# Patient Record
Sex: Female | Born: 1995 | Hispanic: Yes | Marital: Single | Smoking: Never smoker
Health system: Southern US, Community
[De-identification: ages and names within clinical notes are randomized; demographics above are authoritative.]

## PROBLEM LIST (undated history)

## (undated) DIAGNOSIS — Z789 Other specified health status: Secondary | ICD-10-CM

## (undated) DIAGNOSIS — R87612 Low grade squamous intraepithelial lesion on cytologic smear of cervix (LGSIL): Secondary | ICD-10-CM

## (undated) HISTORY — DX: Low grade squamous intraepithelial lesion on cytologic smear of cervix (LGSIL): R87.612

## (undated) HISTORY — PX: NO PAST SURGERIES: SHX2092

---
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
## 2016-10-07 ENCOUNTER — Emergency Department (HOSPITAL_BASED_OUTPATIENT_CLINIC_OR_DEPARTMENT_OTHER): Payer: Self-pay

## 2016-10-07 ENCOUNTER — Emergency Department (HOSPITAL_BASED_OUTPATIENT_CLINIC_OR_DEPARTMENT_OTHER)
Admission: EM | Admit: 2016-10-07 | Discharge: 2016-10-07 | Disposition: A | Discharge status: Home / Self Care | Payer: Self-pay | Attending: Emergency Medicine | Admitting: Emergency Medicine

## 2016-10-07 ENCOUNTER — Encounter (HOSPITAL_BASED_OUTPATIENT_CLINIC_OR_DEPARTMENT_OTHER): Payer: Self-pay | Admitting: *Deleted

## 2016-10-07 DIAGNOSIS — N390 Urinary tract infection, site not specified: Secondary | ICD-10-CM | POA: Insufficient documentation

## 2016-10-07 DIAGNOSIS — N83202 Unspecified ovarian cyst, left side: Secondary | ICD-10-CM | POA: Insufficient documentation

## 2016-10-07 LAB — COMPREHENSIVE METABOLIC PANEL WITH GFR
ALT: 31 U/L (ref 14–54)
AST: 25 U/L (ref 15–41)
Albumin: 4.1 g/dL (ref 3.5–5.0)
Alkaline Phosphatase: 61 U/L (ref 38–126)
Anion gap: 7 (ref 5–15)
BUN: 18 mg/dL (ref 6–20)
CO2: 26 mmol/L (ref 22–32)
Calcium: 9.7 mg/dL (ref 8.9–10.3)
Chloride: 103 mmol/L (ref 101–111)
Creatinine, Ser: 0.76 mg/dL (ref 0.44–1.00)
GFR calc Af Amer: >60 mL/min
GFR calc non Af Amer: >60 mL/min
Glucose, Bld: 84 mg/dL (ref 65–99)
Potassium: 3.6 mmol/L (ref 3.5–5.1)
Sodium: 136 mmol/L (ref 135–145)
Total Bilirubin: 0.5 mg/dL (ref 0.3–1.2)
Total Protein: 7.6 g/dL (ref 6.5–8.1)

## 2016-10-07 LAB — CBC WITH DIFFERENTIAL/PLATELET
Basophils Absolute: 0 10*3/uL (ref 0.0–0.1)
Basophils Relative: 0 %
Eosinophils Absolute: 0.1 10*3/uL (ref 0.0–0.7)
Eosinophils Relative: 1 %
HCT: 39.1 % (ref 36.0–46.0)
Hemoglobin: 13.1 g/dL (ref 12.0–15.0)
Lymphocytes Relative: 31 %
Lymphs Abs: 3.2 10*3/uL (ref 0.7–4.0)
MCH: 30 pg (ref 26.0–34.0)
MCHC: 33.5 g/dL (ref 30.0–36.0)
MCV: 89.7 fL (ref 78.0–100.0)
Monocytes Absolute: 0.8 10*3/uL (ref 0.1–1.0)
Monocytes Relative: 7 %
Neutro Abs: 6.5 10*3/uL (ref 1.7–7.7)
Neutrophils Relative %: 61 %
Platelets: 225 10*3/uL (ref 150–400)
RBC: 4.36 MIL/uL (ref 3.87–5.11)
RDW: 12.7 % (ref 11.5–15.5)
WBC: 10.6 10*3/uL — ABNORMAL HIGH (ref 4.0–10.5)

## 2016-10-07 LAB — LIPASE, BLOOD: Lipase: 19 U/L (ref 11–51)

## 2016-10-07 LAB — URINALYSIS, MICROSCOPIC (REFLEX)

## 2016-10-07 LAB — URINALYSIS, ROUTINE W REFLEX MICROSCOPIC
Bilirubin Urine: NEGATIVE
Glucose, UA: NEGATIVE mg/dL
Hgb urine dipstick: NEGATIVE
Ketones, ur: NEGATIVE mg/dL
Nitrite: POSITIVE — AB
Protein, ur: NEGATIVE mg/dL
Specific Gravity, Urine: 1.02 (ref 1.005–1.030)
pH: 5.5 (ref 5.0–8.0)

## 2016-10-07 LAB — PREGNANCY, URINE: Preg Test, Ur: NEGATIVE

## 2016-10-07 MED ORDER — IOPAMIDOL (ISOVUE-300) INJECTION 61%
100.0000 mL | Freq: Once | INTRAVENOUS | Status: AC | PRN
  Administered 2016-10-07: 100 mL via INTRAVENOUS

## 2016-10-07 MED ORDER — IBUPROFEN 600 MG PO TABS: 600.0000 mg | ORAL_TABLET | Freq: Four times a day (QID) | ORAL | 0 refills | Status: DC | PRN

## 2016-10-07 MED ORDER — CEPHALEXIN 250 MG PO CAPS
1000.0000 mg | ORAL_CAPSULE | Freq: Once | ORAL | Status: AC
Start: 2016-10-07 — End: 2016-10-07
  Administered 2016-10-07: 1000 mg via ORAL
  Filled 2016-10-07: qty 4

## 2016-10-07 MED ORDER — IBUPROFEN 800 MG PO TABS
800.0000 mg | ORAL_TABLET | Freq: Once | ORAL | Status: AC
  Administered 2016-10-07: 800 mg via ORAL
  Filled 2016-10-07: qty 1

## 2016-10-07 MED ORDER — SODIUM CHLORIDE 0.9 % IV SOLN
Freq: Once | INTRAVENOUS | Status: AC
  Administered 2016-10-07: 20:00:00 via INTRAVENOUS

## 2016-10-07 MED ORDER — CEPHALEXIN 500 MG PO CAPS: 1000.0000 mg | ORAL_CAPSULE | Freq: Two times a day (BID) | ORAL | 0 refills | Status: DC

## 2016-10-07 NOTE — ED Notes (Signed)
Patient transported to CT 

## 2016-10-07 NOTE — ED Provider Notes (Signed)
[facility] Provider Note   CSN: [id number] Arrival date & time: 10/07/16  1729  By signing my name below, I, Levon Hedger, attest that this documentation has been prepared under the direction and in the presence of Arby Barrette, MD . Electronically Signed: Levon Hedger, Scribe. 10/07/2016. 7:53 PM.   History   Chief Complaint Chief Complaint  Patient presents with  . Abdominal Pain   HPI Carolyn Riley is a 21 y.o. female who presents to the Emergency Department complaining of sudden onset, persistent abdominal pain that radiates to her back onset three days ago while in class. She describes this as sharp, stabbing LUQ pain; no alleviating or modifying factors noted. She notes associated generalized weakness and one episode of dysuria yesterday. Pt also states she became diaphoretic when the pain began. No prior treatments indicated. Pt denies any fever, chills, sore throat, nasal congestion, cough, vaginal discharge, vaginal bleeding, nausea, or vomiting.  The history is provided by the patient. No language interpreter was used.   History reviewed. No pertinent past medical history.  There are no active problems to display for this patient.   History reviewed. No pertinent surgical history.  OB History    No data available      Home Medications    Prior to Admission medications   Medication Sig Start Date End Date Taking? Authorizing Provider  cephALEXin (KEFLEX) 500 MG capsule Take 2 capsules (1,000 mg total) by mouth 2 (two) times daily. 10/07/16   Arby Barrette, MD  ibuprofen (ADVIL,MOTRIN) 600 MG tablet Take 1 tablet (600 mg total) by mouth every 6 (six) hours as needed. 10/07/16   Arby Barrette, MD    Family History No family history on file.  Social History Social History  Substance Use Topics  . Smoking status: Never Smoker  . Smokeless tobacco: Never Used  . Alcohol use No    Allergies   Patient has no known allergies.  Review of  Systems Review of Systems 10 Systems reviewed and are negative for acute change except as noted in the HPI.   Physical Exam Updated Vital Signs BP 101/62 (BP Location: Left Arm)   Pulse 77   Temp 98.8 F (37.1 C) (Oral)   Resp 16   Ht 5\' 4"  (1.626 m)   Wt 131 lb (59.4 kg)   LMP 09/19/2016   SpO2 100%   BMI 22.49 kg/m   Physical Exam  Constitutional: She is oriented to person, place, and time. She appears well-developed and well-nourished. No distress.  HENT:  Head: Normocephalic and atraumatic.  Mouth/Throat: Oropharynx is clear and moist. No oropharyngeal exudate, posterior oropharyngeal edema, posterior oropharyngeal erythema or tonsillar abscesses.  Eyes: Conjunctivae are normal.  Neck: No thyromegaly present.  No neck mass  Cardiovascular: Normal rate, regular rhythm and normal heart sounds.  Exam reveals no gallop and no friction rub.   No murmur heard. Pulmonary/Chest: Effort normal and breath sounds normal. No respiratory distress. She has no wheezes. She has no rales. She exhibits no tenderness.  Abdominal: She exhibits no distension. There is tenderness (Moderate LUQ and LLQ). There is no CVA tenderness.  Musculoskeletal: She exhibits no edema or tenderness.  No peripheral edema. Calves are non-tender  Lymphadenopathy:    She has no cervical adenopathy.  Neurological: She is alert and oriented to person, place, and time.  Skin: Skin is warm and dry.  Psychiatric: She has a normal mood and affect.  Nursing note and vitals reviewed.  ED Treatments / Results  DIAGNOSTIC STUDIES:  Oxygen Saturation is 100% on RA, normal by my interpretation.    COORDINATION OF CARE:  7:48 PM Discussed treatment plan with pt at bedside and pt agreed to plan.   Labs (all labs ordered are listed, but only abnormal results are displayed) Labs Reviewed  URINE CULTURE - Abnormal; Notable for the following:       Result Value   Culture >=100,000 COLONIES/mL ESCHERICHIA COLI (*)     Organism ID, Bacteria ESCHERICHIA COLI (*)    All other components within normal limits  URINALYSIS, ROUTINE W REFLEX MICROSCOPIC - Abnormal; Notable for the following:    APPearance CLOUDY (*)    Nitrite POSITIVE (*)    Leukocytes, UA SMALL (*)    All other components within normal limits  URINALYSIS, MICROSCOPIC (REFLEX) - Abnormal; Notable for the following:    Bacteria, UA MANY (*)    Squamous Epithelial / LPF 6-30 (*)    All other components within normal limits  CBC WITH DIFFERENTIAL/PLATELET - Abnormal; Notable for the following:    WBC 10.6 (*)    All other components within normal limits  PREGNANCY, URINE  COMPREHENSIVE METABOLIC PANEL  LIPASE, BLOOD    EKG  EKG Interpretation None       Radiology No results found.  Procedures Procedures (including critical care time)  Medications Ordered in ED Medications  0.9 %  sodium chloride infusion ( Intravenous Stopped 10/07/16 2159)  iopamidol (ISOVUE-300) 61 % injection 100 mL (100 mLs Intravenous Contrast Given 10/07/16 2058)  cephALEXin (KEFLEX) capsule 1,000 mg (1,000 mg Oral Given 10/07/16 2215)  ibuprofen (ADVIL,MOTRIN) tablet 800 mg (800 mg Oral Given 10/07/16 2215)     Initial Impression / Assessment and Plan / ED Course  I have reviewed the triage vital signs and the nursing notes.  Pertinent labs & imaging results that were available during my care of the patient were reviewed by me and considered in my medical decision making (see chart for details).       Final Clinical Impressions(s) / ED Diagnoses   Final diagnoses:  Cyst of left ovary  Lower urinary tract infectious disease   Patient is expressing left-sided mid abdominal pain. Sharp and stabbing and radiating to her back. She had also experienced some dysuria. CT does not show any evidence of kidney stone. Urinalysis is positive for UTI. Minor left ovarian cyst also identified. The patient's of abdominal exam is nonsurgical. She is otherwise alert  and well in appearance. No vomiting. will initiate outpatient antibiotics and ibuprofen for pain. New Prescriptions Discharge Medication List as of 10/07/2016  9:47 PM    START taking these medications   Details  cephALEXin (KEFLEX) 500 MG capsule Take 2 capsules (1,000 mg total) by mouth 2 (two) times daily., Starting Fri 10/07/2016, Print    ibuprofen (ADVIL,MOTRIN) 600 MG tablet Take 1 tablet (600 mg total) by mouth every 6 (six) hours as needed., Starting Fri 10/07/2016, Print         Arby BarretteMarcy Izen Petz, MD 10/12/16 1230

## 2016-10-07 NOTE — ED Triage Notes (Signed)
Abdominal pain x 3 days ago. Left mid quadrant pain. Denies constipation.

## 2016-10-10 LAB — URINE CULTURE

## 2016-10-11 ENCOUNTER — Telehealth: Payer: Self-pay | Admitting: Emergency Medicine

## 2016-10-11 NOTE — Telephone Encounter (Signed)
Post ED Visit - Positive Culture Follow-up  Culture report reviewed by antimicrobial stewardship pharmacist:  [x]  [name], Pharm.D. []  [name], Pharm.D., BCPS []  [name], Pharm.D. []  [name], Pharm.D., BCPS []  [name], [address]Pharm[address]D., BCPS, AAHIVP []  [name], Pharm.D., BCPS, AAHIVP []  [name], Pharm.D. []  [name], [address]Pharm[address]  Positive urine culture Treated with cephalexin, organism sensitive to the same and no further patient follow-up is required at this time.  Carolyn MullMiller, Carolyn Riley 10/11/2016, 11:14 AM

## 2016-11-02 ENCOUNTER — Ambulatory Visit: Payer: Self-pay | Admitting: Obstetrics and Gynecology

## 2017-04-03 IMAGING — CT CT ABD-PELV W/ CM
2 of 4 series · 16 of 46 positions shown, 18 images · IV contrast (APPLIED)
Comparison: None.

CLINICAL DATA: 20-year-old female with left mid abdominal pain.

EXAM:
CT ABDOMEN AND PELVIS WITH CONTRAST
TECHNIQUE: Multidetector CT imaging of the abdomen and pelvis was performed
using the standard protocol following bolus administration of
intravenous contrast.
CONTRAST:  100mL UIOHUD-SQQ IOPAMIDOL (UIOHUD-SQQ) INJECTION 61%

[Series 2: axial st · axial · 0.86mm/px · z∈[-417,-2]mm · 13 of 91 slices shown, 15 images]
[im 4/91  soft-tissue]
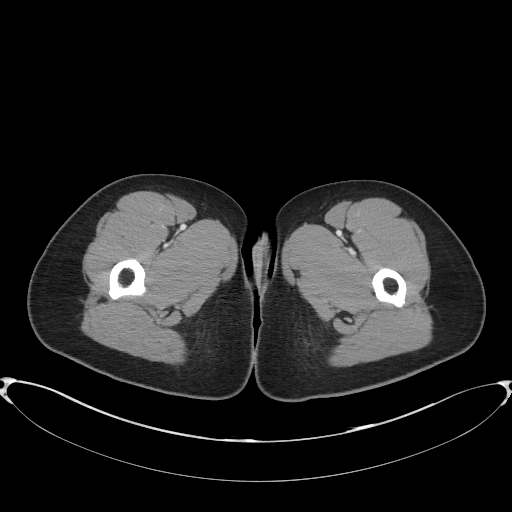
[im 4/91  bone]
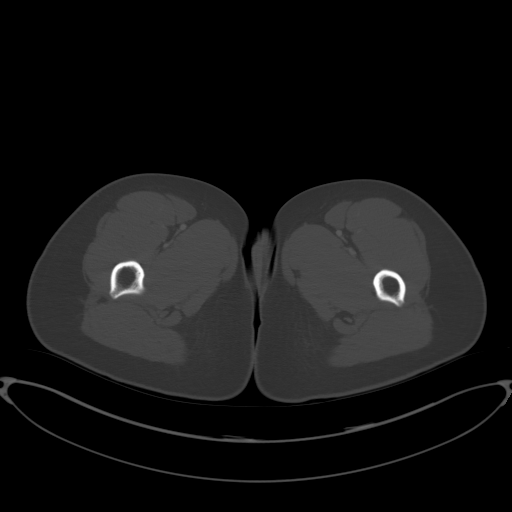
[im 11/91  soft-tissue]
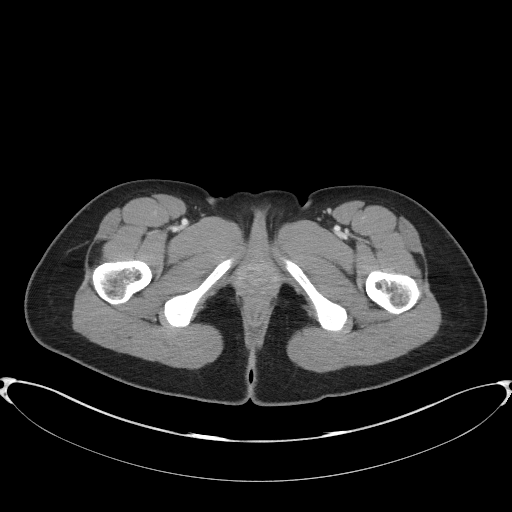
[im 19/91  soft-tissue]
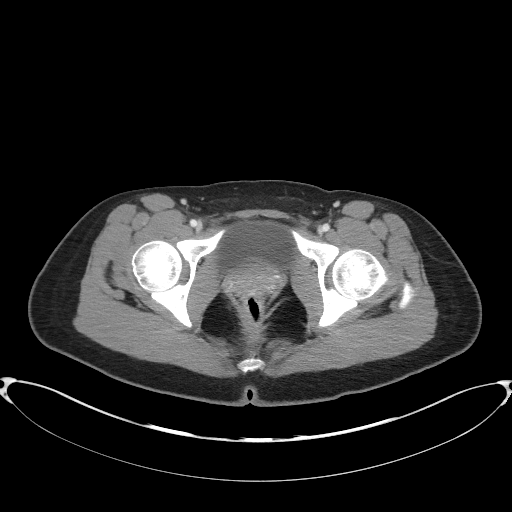
[im 26/91  soft-tissue]
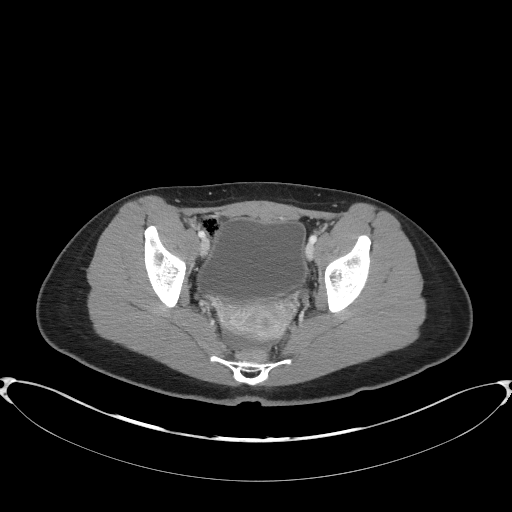
[im 33/91  soft-tissue]
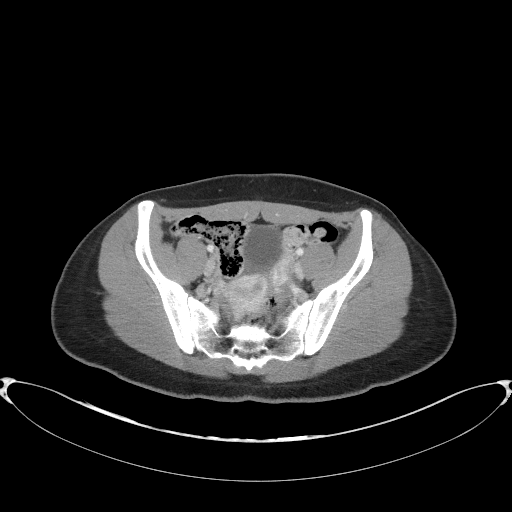
[im 40/91  soft-tissue]
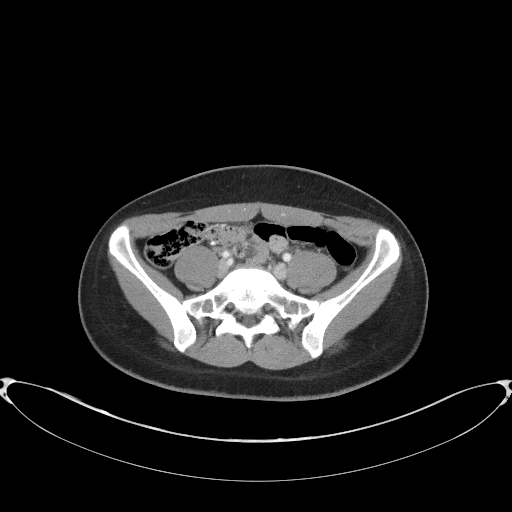
[im 47/91  soft-tissue]
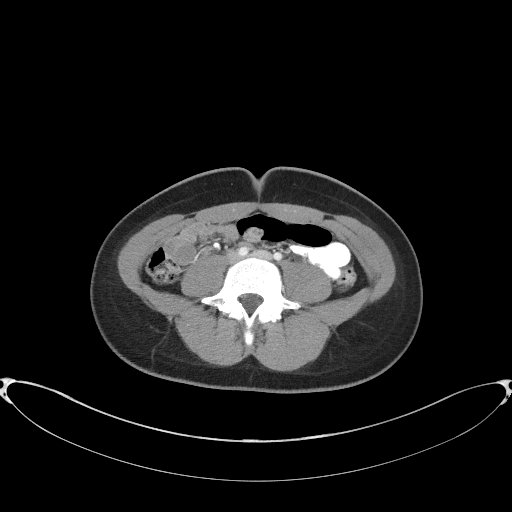
[im 51/91  soft-tissue]
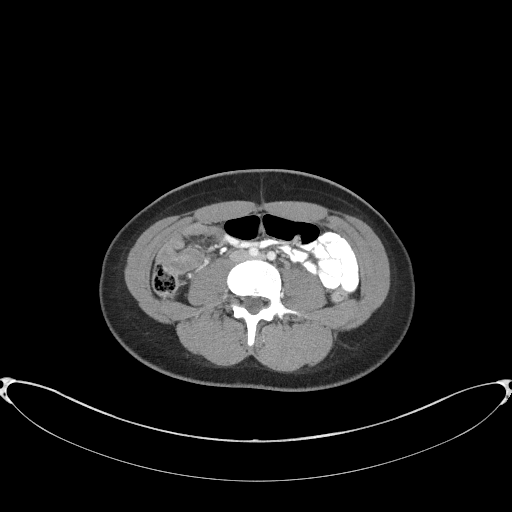
[im 58/91  soft-tissue]
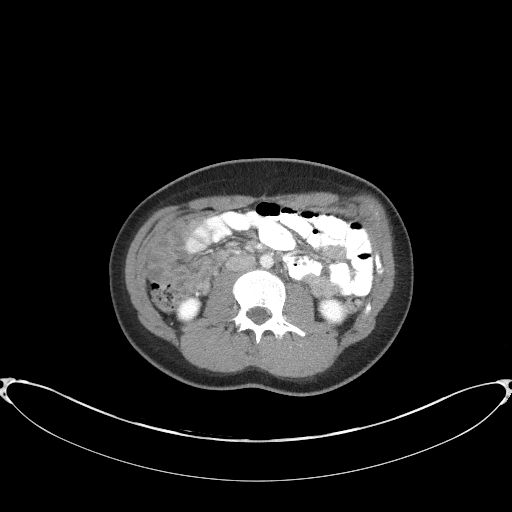
[im 58/91  bone]
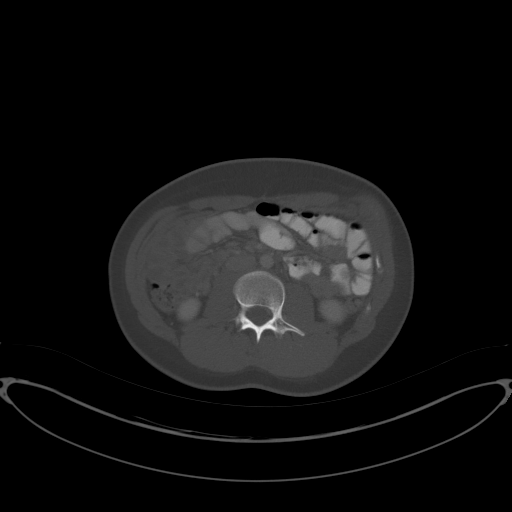
[im 65/91  soft-tissue]
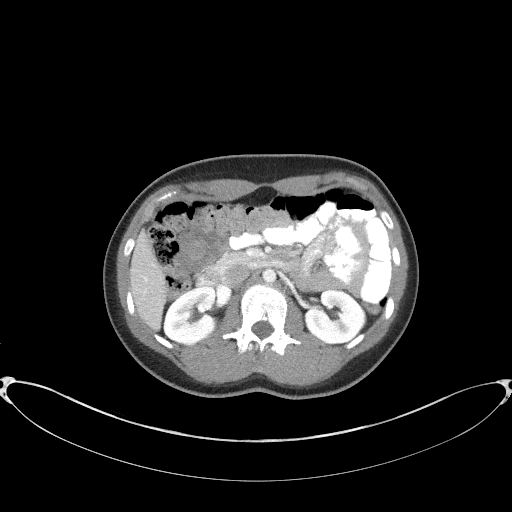
[im 73/91  soft-tissue]
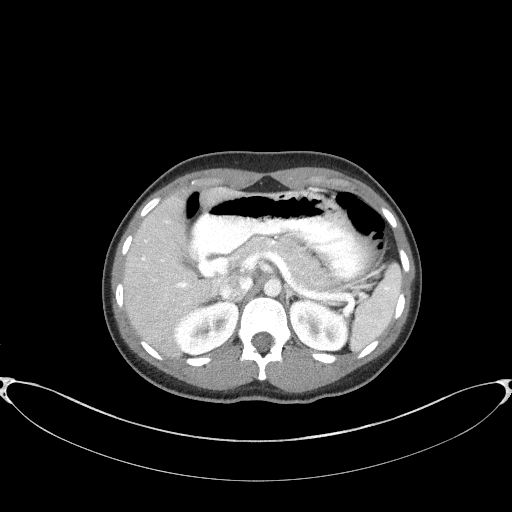
[im 80/91  soft-tissue]
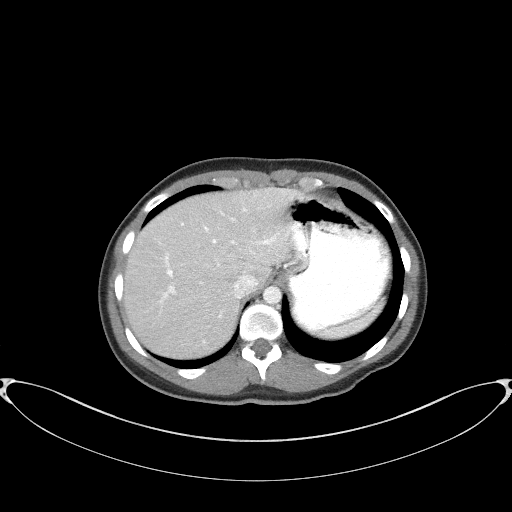
[im 87/91  soft-tissue]
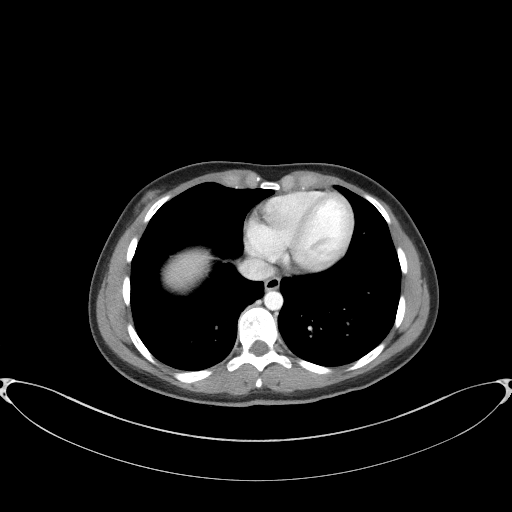

[Series 5: coronal st · coronal · 0.68mm/px · 3 of 68 slices shown]
[im 23/68  soft-tissue]
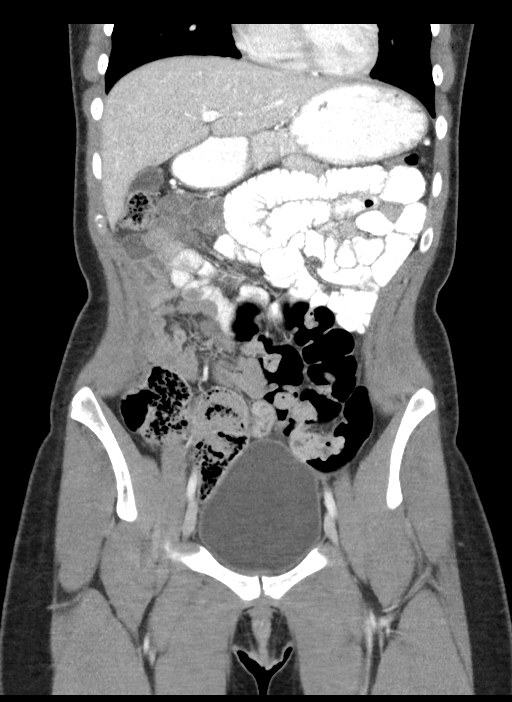
[im 30/68  soft-tissue]
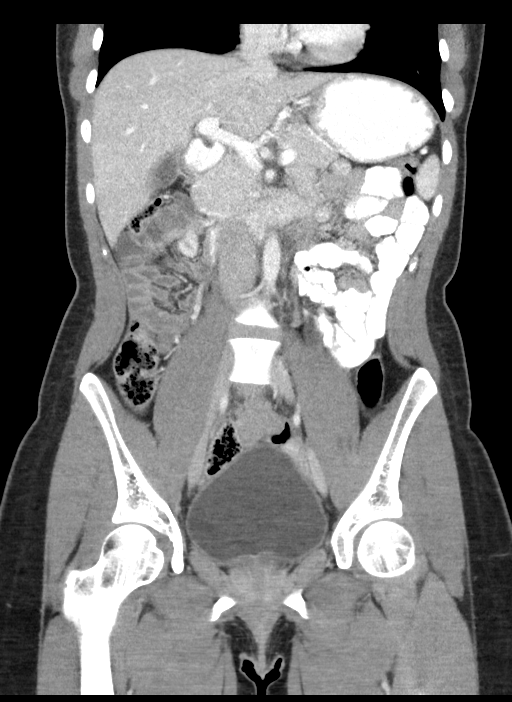
[im 38/68  soft-tissue]
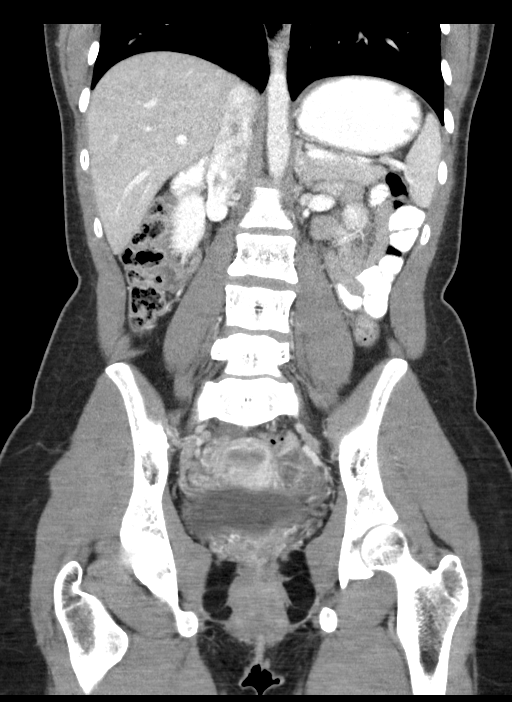

[16 of 46 positions shown; findings below may reference images not displayed]

FINDINGS: Lower chest: The visualized lung bases are clear.

No intra-abdominal free air.  Small free fluid within the pelvis.

Hepatobiliary: No focal liver abnormality is seen. No gallstones,
gallbladder wall thickening, or biliary dilatation.

Pancreas: Unremarkable. No pancreatic ductal dilatation or
surrounding inflammatory changes.

Spleen: Normal in size without focal abnormality.

Adrenals/Urinary Tract: Adrenal glands are unremarkable. Kidneys are
normal, without renal calculi, focal lesion, or hydronephrosis.
Bladder is unremarkable.

Stomach/Bowel: Stomach is within normal limits. Appendix appears
normal. No evidence of bowel wall thickening, distention, or
inflammatory changes.

Vascular/Lymphatic: No significant vascular findings are present. No
enlarged abdominal or pelvic lymph nodes.

Reproductive: There is a 2.5 cm dominant left ovarian follicle or
corpus luteum. The the right ovary is unremarkable. The uterus is
anteverted and grossly unremarkable.

Other: No abdominal wall hernia or abnormality. No abdominopelvic
ascites.

Musculoskeletal: No acute or significant osseous findings.
IMPRESSION: 1. A 2.5 cm probably involuting left ovarian dominant follicle or
corpus luteum.
2. No evidence of bowel obstruction or active inflammation. Normal
appendix.

## 2017-04-13 ENCOUNTER — Other Ambulatory Visit (HOSPITAL_COMMUNITY)
Admission: RE | Admit: 2017-04-13 | Discharge: 2017-04-13 | Disposition: A | Discharge status: Home / Self Care | Payer: Medicaid Other | Source: Ambulatory Visit | Attending: Obstetrics & Gynecology | Admitting: Obstetrics & Gynecology

## 2017-04-13 ENCOUNTER — Ambulatory Visit (INDEPENDENT_AMBULATORY_CARE_PROVIDER_SITE_OTHER): Payer: Medicaid Other | Admitting: Obstetrics & Gynecology

## 2017-04-13 ENCOUNTER — Encounter: Payer: Self-pay | Admitting: Obstetrics & Gynecology

## 2017-04-13 VITALS — BP 102/52 | HR 85 | Wt 140.0 lb

## 2017-04-13 DIAGNOSIS — Z3490 Encounter for supervision of normal pregnancy, unspecified, unspecified trimester: Secondary | ICD-10-CM | POA: Insufficient documentation

## 2017-04-13 DIAGNOSIS — Z3400 Encounter for supervision of normal first pregnancy, unspecified trimester: Secondary | ICD-10-CM | POA: Insufficient documentation

## 2017-04-13 DIAGNOSIS — Z3492 Encounter for supervision of normal pregnancy, unspecified, second trimester: Secondary | ICD-10-CM

## 2017-04-13 NOTE — Progress Notes (Signed)
  Subjective:    Carolyn Riley is 21 yo S H G1being seen today for her first obstetrical visit.  This is not a planned pregnancy. She is at 2368w2d gestation. Her obstetrical history is significant for late prenatal care. Relationship with FOB: significant other, living together. Patient does intend to breast feed. Pregnancy history fully reviewed.  Patient reports no complaints.  Review of Systems:   Review of Systems  Objective:     BP (!) 102/52   Pulse 85   Wt 140 lb (63.5 kg)   LMP 12/20/2016   BMI 24.03 kg/m  Physical Exam  Exam    Assessment:    Pregnancy: G1P0 Patient Active Problem List   Diagnosis Date Noted  . Supervision of normal first pregnancy, antepartum 04/13/2017       Plan:     Initial labs drawn. Prenatal vitamins. Problem list reviewed and updated. AFP3 discussed: declined. Role of ultrasound in pregnancy discussed; fetal survey: ordered. Amniocentesis discussed: not indicated. Follow up in 4 weeks.  Carolyn Riley 04/13/2017

## 2017-04-14 LAB — OBSTETRIC PANEL, INCLUDING HIV
Antibody Screen: NEGATIVE
BASOS: 0 %
Basophils Absolute: 0 10*3/uL (ref 0.0–0.2)
EOS (ABSOLUTE): 0.1 10*3/uL (ref 0.0–0.4)
EOS: 1 %
HEMATOCRIT: 35.1 % (ref 34.0–46.6)
HEP B S AG: NEGATIVE
HIV SCREEN 4TH GENERATION: NONREACTIVE
Hemoglobin: 12.2 g/dL (ref 11.1–15.9)
IMMATURE GRANULOCYTES: 1 %
Immature Grans (Abs): 0.1 10*3/uL (ref 0.0–0.1)
Lymphocytes Absolute: 2.4 10*3/uL (ref 0.7–3.1)
Lymphs: 19 %
MCH: 30.6 pg (ref 26.6–33.0)
MCHC: 34.8 g/dL (ref 31.5–35.7)
MCV: 88 fL (ref 79–97)
MONOCYTES: 6 %
Monocytes Absolute: 0.8 10*3/uL (ref 0.1–0.9)
NEUTROS ABS: 9.4 10*3/uL — AB (ref 1.4–7.0)
Neutrophils: 73 %
Platelets: 226 10*3/uL (ref 150–379)
RBC: 3.99 x10E6/uL (ref 3.77–5.28)
RDW: 13.5 % (ref 12.3–15.4)
RPR: NONREACTIVE
RUBELLA: 2 {index} (ref >0.99)
Rh Factor: POSITIVE
WBC: 12.8 10*3/uL — ABNORMAL HIGH (ref 3.4–10.8)

## 2017-04-17 LAB — CULTURE, URINE COMPREHENSIVE

## 2017-04-19 ENCOUNTER — Encounter: Payer: Self-pay | Admitting: Obstetrics & Gynecology

## 2017-04-19 DIAGNOSIS — N870 Mild cervical dysplasia: Secondary | ICD-10-CM | POA: Insufficient documentation

## 2017-04-19 DIAGNOSIS — O2341 Unspecified infection of urinary tract in pregnancy, first trimester: Secondary | ICD-10-CM

## 2017-04-19 DIAGNOSIS — B951 Streptococcus, group B, as the cause of diseases classified elsewhere: Secondary | ICD-10-CM | POA: Insufficient documentation

## 2017-04-19 LAB — CYTOLOGY - PAP
Chlamydia: NEGATIVE
NEISSERIA GONORRHEA: NEGATIVE

## 2017-04-25 ENCOUNTER — Ambulatory Visit (HOSPITAL_COMMUNITY)
Admission: RE | Admit: 2017-04-25 | Discharge: 2017-04-25 | Disposition: A | Discharge status: Home / Self Care | Payer: Medicaid Other | Source: Ambulatory Visit | Attending: Obstetrics & Gynecology | Admitting: Obstetrics & Gynecology

## 2017-04-25 ENCOUNTER — Other Ambulatory Visit: Payer: Self-pay | Admitting: Obstetrics & Gynecology

## 2017-04-25 DIAGNOSIS — Z3A17 17 weeks gestation of pregnancy: Secondary | ICD-10-CM | POA: Insufficient documentation

## 2017-04-25 DIAGNOSIS — Z3687 Encounter for antenatal screening for uncertain dates: Secondary | ICD-10-CM | POA: Insufficient documentation

## 2017-04-25 DIAGNOSIS — Z3402 Encounter for supervision of normal first pregnancy, second trimester: Secondary | ICD-10-CM | POA: Diagnosis present

## 2017-04-25 DIAGNOSIS — Z3689 Encounter for other specified antenatal screening: Secondary | ICD-10-CM | POA: Diagnosis present

## 2017-04-25 DIAGNOSIS — Z3400 Encounter for supervision of normal first pregnancy, unspecified trimester: Secondary | ICD-10-CM

## 2017-04-25 DIAGNOSIS — Z3A18 18 weeks gestation of pregnancy: Secondary | ICD-10-CM

## 2017-05-04 ENCOUNTER — Ambulatory Visit (HOSPITAL_COMMUNITY): Payer: Self-pay

## 2017-05-11 ENCOUNTER — Ambulatory Visit (INDEPENDENT_AMBULATORY_CARE_PROVIDER_SITE_OTHER): Payer: Medicaid Other | Admitting: Family Medicine

## 2017-05-11 VITALS — BP 96/52 | HR 90 | Wt 147.0 lb

## 2017-05-11 DIAGNOSIS — Z3400 Encounter for supervision of normal first pregnancy, unspecified trimester: Secondary | ICD-10-CM

## 2017-05-11 DIAGNOSIS — Z3402 Encounter for supervision of normal first pregnancy, second trimester: Secondary | ICD-10-CM

## 2017-05-11 NOTE — Progress Notes (Signed)
   PRENATAL VISIT NOTE  Subjective:  Carolyn Riley is a 21 y.o. G1P0 at [redacted]w[redacted]d being seen today for ongoing prenatal care.  She is currently monitored for the following issues for this low-risk pregnancy and has Supervision of normal first pregnancy, antepartum; GBS (group b Streptococcus) UTI complicating pregnancy, first trimester; and LGSIL on Pap smear of cervix on her problem list.  Patient reports no complaints.   . Vag. Bleeding: None.  Movement: Present. Denies leaking of fluid.   The following portions of the patient's history were reviewed and updated as appropriate: allergies, current medications, past family history, past medical history, past social history, past surgical history and problem list. Problem list updated.  Objective:   Vitals:   05/11/17 0824  BP: (!) 96/52  Pulse: 90  Weight: 147 lb (66.7 kg)    Fetal Status:   Fundal Height: 20 cm Movement: Present     General:  Alert, oriented and cooperative. Patient is in no acute distress.  Skin: Skin is warm and dry. No rash noted.   Cardiovascular: Normal heart rate noted  Respiratory: Normal respiratory effort, no problems with respiration noted  Abdomen: Soft, gravid, appropriate for gestational age.  Pain/Pressure: Present     Pelvic: Cervical exam deferred        Extremities: Normal range of motion.  Edema: None  Mental Status:  Normal mood and affect. Normal behavior. Normal judgment and thought content.   Assessment and Plan:  Pregnancy: G1P0 at [redacted]w[redacted]d  1. Supervision of normal first pregnancy, antepartum Discussed echogenic focus. Would like Quad screen. Discussed exercise, weight gain.   Preterm labor symptoms and general obstetric precautions including but not limited to vaginal bleeding, contractions, leaking of fluid and fetal movement were reviewed in detail with the patient. Please refer to After Visit Summary for other counseling recommendations.  No Follow-up on file.   Levie Heritage,  DO

## 2017-05-15 LAB — AFP TETRA
DIA Mom Value: 1.36
DIA Value (EIA): 252.43 pg/mL
DSR (BY AGE) 1 IN: 1139
DSR (Second Trimester) 1 IN: 9423
GESTATIONAL AGE AFP: 19.4 wk
MSAFP Mom: 1.31
MSAFP: 68.6 ng/mL
MSHCG MOM: 1.21
MSHCG: 29817 m[IU]/mL
Maternal Age At EDD: 21.5 yr
Osb Risk: 4664
T18 (By Age): 1:4436 {titer}
Test Results:: NEGATIVE
UE3 MOM: 1.91
UE3 VALUE: 3.15 ng/mL
Weight: 147 [lb_av]

## 2017-07-04 ENCOUNTER — Encounter: Payer: Self-pay | Admitting: Obstetrics & Gynecology

## 2017-07-10 ENCOUNTER — Ambulatory Visit (INDEPENDENT_AMBULATORY_CARE_PROVIDER_SITE_OTHER): Payer: Medicaid Other | Admitting: Obstetrics & Gynecology

## 2017-07-10 VITALS — BP 110/59 | HR 96 | Wt 166.0 lb

## 2017-07-10 DIAGNOSIS — M549 Dorsalgia, unspecified: Secondary | ICD-10-CM

## 2017-07-10 DIAGNOSIS — Z3403 Encounter for supervision of normal first pregnancy, third trimester: Secondary | ICD-10-CM

## 2017-07-10 DIAGNOSIS — O99891 Other specified diseases and conditions complicating pregnancy: Secondary | ICD-10-CM | POA: Insufficient documentation

## 2017-07-10 DIAGNOSIS — R87612 Low grade squamous intraepithelial lesion on cytologic smear of cervix (LGSIL): Secondary | ICD-10-CM

## 2017-07-10 DIAGNOSIS — Z3400 Encounter for supervision of normal first pregnancy, unspecified trimester: Secondary | ICD-10-CM

## 2017-07-10 DIAGNOSIS — O9989 Other specified diseases and conditions complicating pregnancy, childbirth and the puerperium: Secondary | ICD-10-CM

## 2017-07-10 LAB — POCT URINALYSIS DIPSTICK
BILIRUBIN UA: NEGATIVE
GLUCOSE UA: NEGATIVE
KETONES UA: NEGATIVE
Nitrite, UA: NEGATIVE
Spec Grav, UA: 1.015 (ref 1.010–1.025)
Urobilinogen, UA: 0.2 E.U./dL
pH, UA: 6 (ref 5.0–8.0)

## 2017-07-10 NOTE — Progress Notes (Signed)
   PRENATAL VISIT NOTE  Subjective:  Carolyn BumpersSamantha Riley is a 21 y.o. G1P0 at 6112w1d being seen today for ongoing prenatal care.  She is currently monitored for the following issues for this low-risk pregnancy and has Supervision of normal first pregnancy, antepartum; GBS (group b Streptococcus) UTI complicating pregnancy, first trimester; and LGSIL on Pap smear of cervix on their problem list.  Patient reports upper back pain.  Contractions: Not present. Vag. Bleeding: None.  Movement: Present. Denies leaking of fluid.   The following portions of the patient's history were reviewed and updated as appropriate: allergies, current medications, past family history, past medical history, past social history, past surgical history and problem list. Problem list updated.  Objective:   Vitals:   07/10/17 0824  BP: (!) 110/59  Pulse: 96  Weight: 166 lb (75.3 kg)    Fetal Status:     Movement: Present     General:  Alert, oriented and cooperative. Patient is in no acute distress.  Skin: Skin is warm and dry. No rash noted.   Cardiovascular: Normal heart rate noted  Respiratory: Normal respiratory effort, no problems with respiration noted  Abdomen: Soft, gravid, appropriate for gestational age.  Pain/Pressure: Present     Pelvic: Cervical exam deferred        Extremities: Normal range of motion.     Mental Status:  Normal mood and affect. Normal behavior. Normal judgment and thought content.   Assessment and Plan:  Pregnancy: G1P0 at 912w1d  1. Supervision of normal first pregnancy, antepartum  - Glucose Tolerance, 2 Hours w/1 Hour - CBC - HIV antibody - RPR - US MFM OB FOLLOW UP; Future- to complete anatomy   2. LGSIL on Pap smear of cervix  3. Back pain  Rice sock to back  Lift in shoes  Maternity belt   Preterm labor symptoms and general obstetric precautions including but not limited to vaginal bleeding, contractions, leaking of fluid and fetal movement were reviewed in detail  with the patient. Please refer to After Visit Summary for other counseling recommendations.  Return in about 4 weeks (around 08/07/2017).   Willodean Rosenthalarolyn Harraway-Smith, MD

## 2017-07-10 NOTE — Patient Instructions (Addendum)
Third Trimester of Pregnancy The third trimester is from week 29 through week 42, months 7 through 9. This trimester is when your unborn baby (fetus) is growing very fast. At the end of the ninth month, the unborn baby is about 20 inches in length. It weighs about 6-10 pounds. Follow these instructions at home:  Avoid all smoking, herbs, and alcohol. Avoid drugs not approved by your doctor.  Do not use any tobacco products, including cigarettes, chewing tobacco, and electronic cigarettes. If you need help quitting, ask your doctor. You may get counseling or other support to help you quit.  Only take medicine as told by your doctor. Some medicines are safe and some are not during pregnancy.  Exercise only as told by your doctor. Stop exercising if you start having cramps.  Eat regular, healthy meals.  Wear a good support bra if your breasts are tender.  Do not use hot tubs, steam rooms, or saunas.  Wear your seat belt when driving.  Avoid raw meat, uncooked cheese, and liter boxes and soil used by cats.  Take your prenatal vitamins.  Take 1500-2000 milligrams of calcium daily starting at the 20th week of pregnancy until you deliver your baby.  Try taking medicine that helps you poop (stool softener) as needed, and if your doctor approves. Eat more fiber by eating fresh fruit, vegetables, and whole grains. Drink enough fluids to keep your pee (urine) clear or pale yellow.  Take warm water baths (sitz baths) to soothe pain or discomfort caused by hemorrhoids. Use hemorrhoid cream if your doctor approves.  If you have puffy, bulging veins (varicose veins), wear support hose. Raise (elevate) your feet for 15 minutes, 3-4 times a day. Limit salt in your diet.  Avoid heavy lifting, wear low heels, and sit up straight.  Rest with your legs raised if you have leg cramps or low back pain.  Visit your dentist if you have not gone during your pregnancy. Use a soft toothbrush to brush your  teeth. Be gentle when you floss.  You can have sex (intercourse) unless your doctor tells you not to.  Do not travel far distances unless you must. Only do so with your doctor's approval.  Take prenatal classes.  Practice driving to the hospital.  Pack your hospital bag.  Prepare the baby's room.  Go to your doctor visits. Get help if:  You are not sure if you are in labor or if your water has broken.  You are dizzy.  You have mild cramps or pressure in your lower belly (abdominal).  You have a nagging pain in your belly area.  You continue to feel sick to your stomach (nauseous), throw up (vomit), or have watery poop (diarrhea).  You have bad smelling fluid coming from your vagina.  You have pain with peeing (urination). Get help right away if:  You have a fever.  You are leaking fluid from your vagina.  You are spotting or bleeding from your vagina.  You have severe belly cramping or pain.  You lose or gain weight rapidly.  You have trouble catching your breath and have chest pain.  You notice sudden or extreme puffiness (swelling) of your face, hands, ankles, feet, or legs.  You have not felt the baby move in over an hour.  You have severe headaches that do not go away with medicine.  You have vision changes. This information is not intended to replace advice given to you by your health care provider. Make   sure you discuss any questions you have with your health care provider. Document Released: 10/26/2009 Document Revised: 01/07/2016 Document Reviewed: 10/02/2012 Elsevier Interactive Patient Education  2017 [employer] Contraception Choices Contraception (birth control) is the use of any methods or devices to prevent pregnancy. Below are some methods to help avoid pregnancy. Hormonal methods  Contraceptive implant. This is a thin, plastic tube containing progesterone hormone. It does not contain estrogen hormone. Your health care provider inserts the  tube in the inner part of the upper arm. The tube can remain in place for up to 3 years. After 3 years, the implant must be removed. The implant prevents the ovaries from releasing an egg (ovulation), thickens the cervical mucus to prevent sperm from entering the uterus, and thins the lining of the inside of the uterus.  Progesterone-only injections. These injections are given every 3 months by your health care provider to prevent pregnancy. This synthetic progesterone hormone stops the ovaries from releasing eggs. It also thickens cervical mucus and changes the uterine lining. This makes it harder for sperm to survive in the uterus.  Birth control pills. These pills contain estrogen and progesterone hormone. They work by preventing the ovaries from releasing eggs (ovulation). They also cause the cervical mucus to thicken, preventing the sperm from entering the uterus. Birth control pills are prescribed by a health care provider.Birth control pills can also be used to treat heavy periods.  Minipill. This type of birth control pill contains only the progesterone hormone. They are taken every day of each month and must be prescribed by your health care provider.  Birth control patch. The patch contains hormones similar to those in birth control pills. It must be changed once a week and is prescribed by a health care provider.  Vaginal ring. The ring contains hormones similar to those in birth control pills. It is left in the vagina for 3 weeks, removed for 1 week, and then a new one is put back in place. The patient must be comfortable inserting and removing the ring from the vagina.A health care provider's prescription is necessary.  Emergency contraception. Emergency contraceptives prevent pregnancy after unprotected sexual intercourse. This pill can be taken right after sex or up to 5 days after unprotected sex. It is most effective the sooner you take the pills after having sexual intercourse. Most  emergency contraceptive pills are available without a prescription. Check with your pharmacist. Do not use emergency contraception as your only form of birth control. Barrier methods  Female condom. This is a thin sheath (latex or rubber) that is worn over the penis during sexual intercourse. It can be used with spermicide to increase effectiveness.  Female condom. This is a soft, loose-fitting sheath that is put into the vagina before sexual intercourse.  Diaphragm. This is a soft, latex, dome-shaped barrier that must be fitted by a health care provider. It is inserted into the vagina, along with a spermicidal jelly. It is inserted before intercourse. The diaphragm should be left in the vagina for 6 to 8 hours after intercourse.  Cervical cap. This is a round, soft, latex or plastic cup that fits over the cervix and must be fitted by a health care provider. The cap can be left in place for up to 48 hours after intercourse.  Sponge. This is a soft, circular piece of polyurethane foam. The sponge has spermicide in it. It is inserted into the vagina after wetting it and before sexual intercourse.  Spermicides. These are chemicals   that kill or block sperm from entering the cervix and uterus. They come in the form of creams, jellies, suppositories, foam, or tablets. They do not require a prescription. They are inserted into the vagina with an applicator before having sexual intercourse. The process must be repeated every time you have sexual intercourse. Intrauterine contraception  Intrauterine device (IUD). This is a T-shaped device that is put in a woman's uterus during a menstrual period to prevent pregnancy. There are 2 types: ? Copper IUD. This type of IUD is wrapped in copper wire and is placed inside the uterus. Copper makes the uterus and fallopian tubes produce a fluid that kills sperm. It can stay in place for 10 years. ? Hormone IUD. This type of IUD contains the hormone progestin (synthetic  progesterone). The hormone thickens the cervical mucus and prevents sperm from entering the uterus, and it also thins the uterine lining to prevent implantation of a fertilized egg. The hormone can weaken or kill the sperm that get into the uterus. It can stay in place for 3-5 years, depending on which type of IUD is used. Permanent methods of contraception  Female tubal ligation. This is when the woman's fallopian tubes are surgically sealed, tied, or blocked to prevent the egg from traveling to the uterus.  Hysteroscopic sterilization. This involves placing a small coil or insert into each fallopian tube. Your doctor uses a technique called hysteroscopy to do the procedure. The device causes scar tissue to form. This results in permanent blockage of the fallopian tubes, so the sperm cannot fertilize the egg. It takes about 3 months after the procedure for the tubes to become blocked. You must use another form of birth control for these 3 months.  Female sterilization. This is when the female has the tubes that carry sperm tied off (vasectomy).This blocks sperm from entering the vagina during sexual intercourse. After the procedure, the man can still ejaculate fluid (semen). Natural planning methods  Natural family planning. This is not having sexual intercourse or using a barrier method (condom, diaphragm, cervical cap) on days the woman could become pregnant.  Calendar method. This is keeping track of the length of each menstrual cycle and identifying when you are fertile.  Ovulation method. This is avoiding sexual intercourse during ovulation.  Symptothermal method. This is avoiding sexual intercourse during ovulation, using a thermometer and ovulation symptoms.  Post-ovulation method. This is timing sexual intercourse after you have ovulated. Regardless of which type or method of contraception you choose, it is important that you use condoms to protect against the transmission of sexually  transmitted infections (STIs). Talk with your health care provider about which form of contraception is most appropriate for you. This information is not intended to replace advice given to you by your health care provider. Make sure you discuss any questions you have with your health care provider. Document Released: 08/01/2005 Document Revised: 01/07/2016 Document Reviewed: 01/24/2013 Elsevier Interactive Patient Education  2017 [employer]. Levonorgestrel intrauterine device (IUD) What is this medicine? LEVONORGESTREL IUD (LEE voe nor jes trel) is a contraceptive (birth control) device. The device is placed inside the uterus by a healthcare professional. It is used to prevent pregnancy. This device can also be used to treat heavy bleeding that occurs during your period. This medicine may be used for other purposes; ask your health care provider or pharmacist if you have questions. COMMON BRAND NAME(S): Cameron Ali What should I tell my health care provider before I take this  medicine? They need to know if you have any of these conditions: -abnormal Pap smear -cancer of the breast, uterus, or cervix -diabetes -endometritis -genital or pelvic infection now or in the past -have more than one sexual partner or your partner has more than one partner -heart disease -history of an ectopic or tubal pregnancy -immune system problems -IUD in place -liver disease or tumor -problems with blood clots or take blood-thinners -seizures -use intravenous drugs -uterus of unusual shape -vaginal bleeding that has not been explained -an unusual or allergic reaction to levonorgestrel, other hormones, silicone, or polyethylene, medicines, foods, dyes, or preservatives -pregnant or trying to get pregnant -breast-feeding How should I use this medicine? This device is placed inside the uterus by a health care professional. Talk to your pediatrician regarding the use of this medicine in  children. Special care may be needed. Overdosage: If you think you have taken too much of this medicine contact a poison control center or emergency room at once. NOTE: This medicine is only for you. Do not share this medicine with others. What if I miss a dose? This does not apply. Depending on the brand of device you have inserted, the device will need to be replaced every 3 to 5 years if you wish to continue using this type of birth control. What may interact with this medicine? Do not take this medicine with any of the following medications: -amprenavir -bosentan -fosamprenavir This medicine may also interact with the following medications: -aprepitant -armodafinil -barbiturate medicines for inducing sleep or treating seizures -bexarotene -boceprevir -griseofulvin -medicines to treat seizures like carbamazepine, ethotoin, felbamate, oxcarbazepine, phenytoin, topiramate -modafinil -pioglitazone -rifabutin -rifampin -rifapentine -some medicines to treat HIV infection like atazanavir, efavirenz, indinavir, lopinavir, nelfinavir, tipranavir, ritonavir -St. John's wort -warfarin This list may not describe all possible interactions. Give your health care provider a list of all the medicines, herbs, non-prescription drugs, or dietary supplements you use. Also tell them if you smoke, drink alcohol, or use illegal drugs. Some items may interact with your medicine. What should I watch for while using this medicine? Visit your doctor or health care professional for regular check ups. See your doctor if you or your partner has sexual contact with others, becomes HIV positive, or gets a sexual transmitted disease. This product does not protect you against HIV infection (AIDS) or other sexually transmitted diseases. You can check the placement of the IUD yourself by reaching up to the top of your vagina with clean fingers to feel the threads. Do not pull on the threads. It is a good habit to  check placement after each menstrual period. Call your doctor right away if you feel more of the IUD than just the threads or if you cannot feel the threads at all. The IUD may come out by itself. You may become pregnant if the device comes out. If you notice that the IUD has come out use a backup birth control method like condoms and call your health care provider. Using tampons will not change the position of the IUD and are okay to use during your period. This IUD can be safely scanned with magnetic resonance imaging (MRI) only under specific conditions. Before you have an MRI, tell your healthcare provider that you have an IUD in place, and which type of IUD you have in place. What side effects may I notice from receiving this medicine? Side effects that you should report to your doctor or health care professional as soon as possible: -allergic reactions  like skin rash, itching or hives, swelling of the face, lips, or tongue -fever, flu-like symptoms -genital sores -high blood pressure -no menstrual period for 6 weeks during use -pain, swelling, warmth in the leg -pelvic pain or tenderness -severe or sudden headache -signs of pregnancy -stomach cramping -sudden shortness of breath -trouble with balance, talking, or walking -unusual vaginal bleeding, discharge -yellowing of the eyes or skin Side effects that usually do not require medical attention (report to your doctor or health care professional if they continue or are bothersome): -acne -breast pain -change in sex drive or performance -changes in weight -cramping, dizziness, or faintness while the device is being inserted -headache -irregular menstrual bleeding within first 3 to 6 months of use -nausea This list may not describe all possible side effects. Call your doctor for medical advice about side effects. You may report side effects to FDA at 1-800-FDA-1088. Where should I keep my medicine? This does not apply. NOTE: This  sheet is a summary. It may not cover all possible information. If you have questions about this medicine, talk to your doctor, pharmacist, or health care provider.  2018 Elsevier/Gold Standard (2016-05-13 14:14:56)

## 2017-07-11 LAB — CBC
HEMATOCRIT: 35.1 % (ref 34.0–46.6)
Hemoglobin: 11.8 g/dL (ref 11.1–15.9)
MCH: 30.9 pg (ref 26.6–33.0)
MCHC: 33.6 g/dL (ref 31.5–35.7)
MCV: 92 fL (ref 79–97)
Platelets: 230 10*3/uL (ref 150–379)
RBC: 3.82 x10E6/uL (ref 3.77–5.28)
RDW: 13.4 % (ref 12.3–15.4)
WBC: 12.7 10*3/uL — ABNORMAL HIGH (ref 3.4–10.8)

## 2017-07-11 LAB — GLUCOSE TOLERANCE, 2 HOURS W/ 1HR
GLUCOSE, 2 HOUR: 67 mg/dL (ref 65–152)
Glucose, 1 hour: 112 mg/dL (ref 65–179)
Glucose, Fasting: 75 mg/dL (ref 65–91)

## 2017-07-11 LAB — HIV ANTIBODY (ROUTINE TESTING W REFLEX): HIV Screen 4th Generation wRfx: NONREACTIVE

## 2017-07-11 LAB — RPR: RPR Ser Ql: NONREACTIVE

## 2017-07-12 LAB — CULTURE, OB URINE

## 2017-07-12 LAB — URINE CULTURE, OB REFLEX

## 2017-07-17 ENCOUNTER — Ambulatory Visit (HOSPITAL_COMMUNITY)
Admission: RE | Admit: 2017-07-17 | Discharge: 2017-07-17 | Disposition: A | Discharge status: Home / Self Care | Payer: Medicaid Other | Source: Ambulatory Visit | Attending: Obstetrics & Gynecology | Admitting: Obstetrics & Gynecology

## 2017-07-17 ENCOUNTER — Other Ambulatory Visit: Payer: Self-pay | Admitting: Obstetrics & Gynecology

## 2017-07-17 DIAGNOSIS — Z362 Encounter for other antenatal screening follow-up: Secondary | ICD-10-CM | POA: Insufficient documentation

## 2017-07-17 DIAGNOSIS — Z3A29 29 weeks gestation of pregnancy: Secondary | ICD-10-CM | POA: Diagnosis not present

## 2017-07-17 DIAGNOSIS — Z3400 Encounter for supervision of normal first pregnancy, unspecified trimester: Secondary | ICD-10-CM

## 2017-07-20 ENCOUNTER — Encounter: Payer: Self-pay | Admitting: Obstetrics & Gynecology

## 2017-08-04 ENCOUNTER — Ambulatory Visit (INDEPENDENT_AMBULATORY_CARE_PROVIDER_SITE_OTHER): Payer: Medicaid Other | Admitting: Obstetrics & Gynecology

## 2017-08-04 ENCOUNTER — Encounter: Payer: Self-pay | Admitting: Obstetrics & Gynecology

## 2017-08-04 VITALS — BP 108/51 | HR 92 | Wt 168.0 lb

## 2017-08-04 DIAGNOSIS — Z3400 Encounter for supervision of normal first pregnancy, unspecified trimester: Secondary | ICD-10-CM

## 2017-08-04 DIAGNOSIS — M549 Dorsalgia, unspecified: Secondary | ICD-10-CM

## 2017-08-04 DIAGNOSIS — O9989 Other specified diseases and conditions complicating pregnancy, childbirth and the puerperium: Secondary | ICD-10-CM

## 2017-08-04 DIAGNOSIS — B951 Streptococcus, group B, as the cause of diseases classified elsewhere: Secondary | ICD-10-CM

## 2017-08-04 DIAGNOSIS — Z3403 Encounter for supervision of normal first pregnancy, third trimester: Secondary | ICD-10-CM

## 2017-08-04 DIAGNOSIS — O2343 Unspecified infection of urinary tract in pregnancy, third trimester: Secondary | ICD-10-CM

## 2017-08-04 DIAGNOSIS — R87612 Low grade squamous intraepithelial lesion on cytologic smear of cervix (LGSIL): Secondary | ICD-10-CM

## 2017-08-04 DIAGNOSIS — O2341 Unspecified infection of urinary tract in pregnancy, first trimester: Secondary | ICD-10-CM

## 2017-08-04 NOTE — Patient Instructions (Signed)

## 2017-08-04 NOTE — Progress Notes (Signed)
   PRENATAL VISIT NOTE  Subjective:  Carolyn Riley is a 21 y.o. G1P0 at 5964w5d being seen today for ongoing prenatal care.  She is currently monitored for the following issues for this low-risk pregnancy and has Supervision of normal first pregnancy, antepartum; GBS (group b Streptococcus) UTI complicating pregnancy, first trimester; LGSIL on Pap smear of cervix; and Back pain affecting pregnancy, antepartum on their problem list.  Patient reports no complaints.  Contractions: Not present.  .  Movement: Present. Denies leaking of fluid.   The following portions of the patient's history were reviewed and updated as appropriate: allergies, current medications, past family history, past medical history, past social history, past surgical history and problem list. Problem list updated.  Objective:   Vitals:   08/04/17 0819  BP: (!) 108/51  Pulse: 92  Weight: 168 lb (76.2 kg)    Fetal Status:     Movement: Present     General:  Alert, oriented and cooperative. Patient is in no acute distress.  Skin: Skin is warm and dry. No rash noted.   Cardiovascular: Normal heart rate noted  Respiratory: Normal respiratory effort, no problems with respiration noted  Abdomen: Soft, gravid, appropriate for gestational age.  Pain/Pressure: Absent     Pelvic: Cervical exam deferred        Extremities: Normal range of motion.  Edema: None  Mental Status:  Normal mood and affect. Normal behavior. Normal judgment and thought content.   Assessment and Plan:  Pregnancy: G1P0 at 5864w5d  1. Supervision of normal first pregnancy, antepartum  2. LGSIL on Pap smear of cervix  3. GBS (group b Streptococcus) UTI complicating pregnancy, first trimester  4. Back pain affecting pregnancy, antepartum No complaints.   Preterm labor symptoms and general obstetric precautions including but not limited to vaginal bleeding, contractions, leaking of fluid and fetal movement were reviewed in detail with the  patient. Please refer to After Visit Summary for other counseling recommendations.  Return in 4 weeks.  Willodean Rosenthalarolyn Harraway-Smith, MD

## 2017-08-15 NOTE — L&D Delivery Note (Signed)
Patient is 22 y.o. G1P0 1416w5d admitted SOL. S/p augmentation with Pitocin. AROM at 1439. GBS positive with adequate treatment. Uncomplicated prenatal caourse.  Delivery Note At 6:58 PM a viable female was delivered via Vaginal, Spontaneous (Presentation: ROA ).  APGAR: 7, 9; weight pending.   Placenta status: Intact.  Cord: 3V with the following complications: None.  Cord pH: N/A  Anesthesia:  Epidural Episiotomy: None Lacerations: Vaginal Suture Repair: 3.0 monocryl Est. Blood Loss (mL): 50  Mom to postpartum.  Baby to Couplet care / Skin to Skin.  Caryl AdaJazma Jaelin Fackler, DO 10/06/2017, 7:16 PM

## 2017-09-01 ENCOUNTER — Ambulatory Visit (INDEPENDENT_AMBULATORY_CARE_PROVIDER_SITE_OTHER): Payer: Medicaid Other | Admitting: Family Medicine

## 2017-09-01 ENCOUNTER — Other Ambulatory Visit (HOSPITAL_COMMUNITY)
Admission: RE | Admit: 2017-09-01 | Discharge: 2017-09-01 | Disposition: A | Discharge status: Home / Self Care | Payer: Medicaid Other | Source: Ambulatory Visit | Attending: Family Medicine | Admitting: Family Medicine

## 2017-09-01 VITALS — BP 117/59 | HR 86 | Wt 177.0 lb

## 2017-09-01 DIAGNOSIS — B951 Streptococcus, group B, as the cause of diseases classified elsewhere: Secondary | ICD-10-CM

## 2017-09-01 DIAGNOSIS — Z3400 Encounter for supervision of normal first pregnancy, unspecified trimester: Secondary | ICD-10-CM | POA: Insufficient documentation

## 2017-09-01 DIAGNOSIS — O2341 Unspecified infection of urinary tract in pregnancy, first trimester: Secondary | ICD-10-CM

## 2017-09-01 NOTE — Progress Notes (Signed)
   PRENATAL VISIT NOTE  Subjective:  Carolyn BumpersSamantha Riley is a 22 y.o. G1P0 at 5628w5d being seen today for ongoing prenatal care.  She is currently monitored for the following issues for this low-risk pregnancy and has Supervision of normal first pregnancy, antepartum; GBS (group b Streptococcus) UTI complicating pregnancy, first trimester; LGSIL on Pap smear of cervix; and Back pain affecting pregnancy, antepartum on their problem list.  Patient reports heartburn and occasional contractions.  Contractions: Not present. Vag. Bleeding: None.  Movement: Present. Denies leaking of fluid.   The following portions of the patient's history were reviewed and updated as appropriate: allergies, current medications, past family history, past medical history, past social history, past surgical history and problem list. Problem list updated.  Objective:   Vitals:   09/01/17 0839  BP: (!) 117/59  Pulse: 86  Weight: 177 lb (80.3 kg)    Fetal Status:     Movement: Present     General:  Alert, oriented and cooperative. Patient is in no acute distress.  Skin: Skin is warm and dry. No rash noted.   Cardiovascular: Normal heart rate noted  Respiratory: Normal respiratory effort, no problems with respiration noted  Abdomen: Soft, gravid, appropriate for gestational age.  Pain/Pressure: Present     Pelvic: Cervical exam deferred        Extremities: Normal range of motion.  Edema: None  Mental Status:  Normal mood and affect. Normal behavior. Normal judgment and thought content.   Assessment and Plan:  Pregnancy: G1P0 at 6228w5d  1. Supervision of normal first pregnancy, antepartum FHT and FH normal  2. GBS (group b Streptococcus) UTI complicating pregnancy, first trimester Prophylaxis intrapartum   Preterm labor symptoms and general obstetric precautions including but not limited to vaginal bleeding, contractions, leaking of fluid and fetal movement were reviewed in detail with the patient. Please refer  to After Visit Summary for other counseling recommendations.  Return in about 2 weeks (around 09/15/2017).   Levie HeritageJacob J Livie Vanderhoof, DO

## 2017-09-04 LAB — GC/CHLAMYDIA PROBE AMP (~~LOC~~) NOT AT ARMC
Chlamydia: NEGATIVE
NEISSERIA GONORRHEA: NEGATIVE

## 2017-09-15 ENCOUNTER — Ambulatory Visit (INDEPENDENT_AMBULATORY_CARE_PROVIDER_SITE_OTHER): Payer: Medicaid Other | Admitting: Family Medicine

## 2017-09-15 VITALS — BP 115/66 | HR 76 | Wt 185.0 lb

## 2017-09-15 DIAGNOSIS — B951 Streptococcus, group B, as the cause of diseases classified elsewhere: Secondary | ICD-10-CM

## 2017-09-15 DIAGNOSIS — O2341 Unspecified infection of urinary tract in pregnancy, first trimester: Secondary | ICD-10-CM

## 2017-09-15 DIAGNOSIS — Z3400 Encounter for supervision of normal first pregnancy, unspecified trimester: Secondary | ICD-10-CM

## 2017-09-15 DIAGNOSIS — R87612 Low grade squamous intraepithelial lesion on cytologic smear of cervix (LGSIL): Secondary | ICD-10-CM

## 2017-09-15 NOTE — Progress Notes (Signed)
   PRENATAL VISIT NOTE  Subjective:  Carolyn Riley is a 22 y.o. G1P0 at 2236w5d being seen today for ongoing prenatal care.  She is currently monitored for the following issues for this low-risk pregnancy and has Supervision of normal first pregnancy, antepartum; GBS (group b Streptococcus) UTI complicating pregnancy, first trimester; LGSIL on Pap smear of cervix; and Back pain affecting pregnancy, antepartum on their problem list.  Patient reports no complaints.  Contractions: Irregular. Vag. Bleeding: None.  Movement: Present. Denies leaking of fluid.   The following portions of the patient's history were reviewed and updated as appropriate: allergies, current medications, past family history, past medical history, past social history, past surgical history and problem list. Problem list updated.  Objective:   Vitals:   09/15/17 0921  BP: 115/66  Pulse: 76  Weight: 185 lb (83.9 kg)    Fetal Status: Fetal Heart Rate (bpm): 130   Movement: Present     General:  Alert, oriented and cooperative. Patient is in no acute distress.  Skin: Skin is warm and dry. No rash noted.   Cardiovascular: Normal heart rate noted  Respiratory: Normal respiratory effort, no problems with respiration noted  Abdomen: Soft, gravid, appropriate for gestational age.  Pain/Pressure: Present     Pelvic: Cervical exam deferred        Extremities: Normal range of motion.  Edema: Trace  Mental Status:  Normal mood and affect. Normal behavior. Normal judgment and thought content.   Assessment and Plan:  Pregnancy: G1P0 at 2236w5d  1. Supervision of normal first pregnancy, antepartum FHT and FH normal  2. GBS (group b Streptococcus) UTI complicating pregnancy, first trimester Intrapartum prophylaxis  3. LGSIL on Pap smear of cervix   Term labor symptoms and general obstetric precautions including but not limited to vaginal bleeding, contractions, leaking of fluid and fetal movement were reviewed in detail  with the patient. Please refer to After Visit Summary for other counseling recommendations.  No Follow-up on file.   Levie HeritageJacob J [place], DO

## 2017-09-19 ENCOUNTER — Ambulatory Visit (INDEPENDENT_AMBULATORY_CARE_PROVIDER_SITE_OTHER): Payer: Medicaid Other | Admitting: Advanced Practice Midwife

## 2017-09-19 VITALS — BP 106/62 | HR 75 | Wt 181.0 lb

## 2017-09-19 DIAGNOSIS — B951 Streptococcus, group B, as the cause of diseases classified elsewhere: Secondary | ICD-10-CM

## 2017-09-19 DIAGNOSIS — O2341 Unspecified infection of urinary tract in pregnancy, first trimester: Secondary | ICD-10-CM

## 2017-09-19 DIAGNOSIS — Z3400 Encounter for supervision of normal first pregnancy, unspecified trimester: Secondary | ICD-10-CM

## 2017-09-19 NOTE — Progress Notes (Signed)
   PRENATAL VISIT NOTE  Subjective:  Carolyn BumpersSamantha Riley is a 22 y.o. G1P0 at 8060w2d being seen today for ongoing prenatal care.  She is currently monitored for the following issues for this low-risk pregnancy and has Supervision of normal first pregnancy, antepartum; GBS (group b Streptococcus) UTI complicating pregnancy, first trimester; LGSIL on Pap smear of cervix; and Back pain affecting pregnancy, antepartum on their problem list.  Patient reports occasional contractions.  Contractions: Irritability. Vag. Bleeding: None.  Movement: Present. Denies leaking of fluid.   The following portions of the patient's history were reviewed and updated as appropriate: allergies, current medications, past family history, past medical history, past social history, past surgical history and problem list. Problem list updated.  Objective:   Vitals:   09/19/17 0909  BP: 106/62  Pulse: 75  Weight: 181 lb (82.1 kg)    Fetal Status: Fetal Heart Rate (bpm): 134 Fundal Height: 39 cm Movement: Present  Presentation: Vertex  General:  Alert, oriented and cooperative. Patient is in no acute distress.  Skin: Skin is warm and dry. No rash noted.   Cardiovascular: Normal heart rate noted  Respiratory: Normal respiratory effort, no problems with respiration noted  Abdomen: Soft, gravid, appropriate for gestational age.  Pain/Pressure: Present     Pelvic: Cervical exam deferred        Extremities: Normal range of motion.  Edema: Mild pitting, slight indentation  Mental Status:  Normal mood and affect. Normal behavior. Normal judgment and thought content.   Assessment and Plan:  Pregnancy: G1P0 at 3460w2d  1. Supervision of normal first pregnancy, antepartum   2. GBS (group b Streptococcus) UTI complicating pregnancy, first trimester - PCN in labor  Term labor symptoms and general obstetric precautions including but not limited to vaginal bleeding, contractions, leaking of fluid and fetal movement were  reviewed in detail with the patient. Please refer to After Visit Summary for other counseling recommendations.  Return for ROB. 1 wk   [place] Ester Mabe, [place]

## 2017-09-19 NOTE — Patient Instructions (Signed)
Braxton Hicks Contractions Contractions of the uterus can occur throughout pregnancy, but they are not always a sign that you are in labor. You may have practice contractions called Braxton Hicks contractions. These false labor contractions are sometimes confused with true labor. What are Braxton Hicks contractions? Braxton Hicks contractions are tightening movements that occur in the muscles of the uterus before labor. Unlike true labor contractions, these contractions do not result in opening (dilation) and thinning of the cervix. Toward the end of pregnancy (32-34 weeks), Braxton Hicks contractions can happen more often and may become stronger. These contractions are sometimes difficult to tell apart from true labor because they can be very uncomfortable. You should not feel embarrassed if you go to the hospital with false labor. Sometimes, the only way to tell if you are in true labor is for your health care provider to look for changes in the cervix. The health care provider will do a physical exam and may monitor your contractions. If you are not in true labor, the exam should show that your cervix is not dilating and your water has not broken. If there are other health problems associated with your pregnancy, it is completely safe for you to be sent home with false labor. You may continue to have Braxton Hicks contractions until you go into true labor. How to tell the difference between true labor and false labor True labor  Contractions last 30-70 seconds.  Contractions become very regular.  Discomfort is usually felt in the top of the uterus, and it spreads to the lower abdomen and low back.  Contractions do not go away with walking.  Contractions usually become more intense and increase in frequency.  The cervix dilates and gets thinner. False labor  Contractions are usually shorter and not as strong as true labor contractions.  Contractions are usually irregular.  Contractions  are often felt in the front of the lower abdomen and in the groin.  Contractions may go away when you walk around or change positions while lying down.  Contractions get weaker and are shorter-lasting as time goes on.  The cervix usually does not dilate or become thin. Follow these instructions at home:  Take over-the-counter and prescription medicines only as told by your health care provider.  Keep up with your usual exercises and follow other instructions from your health care provider.  Eat and drink lightly if you think you are going into labor.  If Braxton Hicks contractions are making you uncomfortable: ? Change your position from lying down or resting to walking, or change from walking to resting. ? Sit and rest in a tub of warm water. ? Drink enough fluid to keep your urine pale yellow. Dehydration may cause these contractions. ? Do slow and deep breathing several times an hour.  Keep all follow-up prenatal visits as told by your health care provider. This is important. Contact a health care provider if:  You have a fever.  You have continuous pain in your abdomen. Get help right away if:  Your contractions become stronger, more regular, and closer together.  You have fluid leaking or gushing from your vagina.  You pass blood-tinged mucus (bloody show).  You have bleeding from your vagina.  You have low back pain that you never had before.  You feel your baby's head pushing down and causing pelvic pressure.  Your baby is not moving inside you as much as it used to. Summary  Contractions that occur before labor are called Braxton   Hicks contractions, false labor, or practice contractions.  Braxton Hicks contractions are usually shorter, weaker, farther apart, and less regular than true labor contractions. True labor contractions usually become progressively stronger and regular and they become more frequent.  Manage discomfort from [facility] contractions by  changing position, resting in a warm bath, drinking plenty of water, or practicing deep breathing. This information is not intended to replace advice given to you by your health care provider. Make sure you discuss any questions you have with your health care provider. Document Released: 12/15/2016 Document Revised: 12/15/2016 Document Reviewed: 12/15/2016 Elsevier Interactive Patient Education  2018 [employer].     Deciding about Circumcision in [place] Boys  (Up-to-date The Basics)  What is circumcision?  Circumcision is a surgery that removes the skin that covers the tip of the penis, called the "foreskin" Circumcision is usually done when a boy is between 29 and 37 days old. In the [place], circumcision is common. In some other countries, fewer boys are circumcised. Circumcision is a common tradition in some religions.  Should I have my baby boy circumcised?  There is no easy answer. Circumcision has some benefits. But it also has risks. After talking with your doctor, you will have to decide for yourself what is right for your family.  What are the benefits of circumcision?  Circumcised boys seem to have slightly lower rates of: ?Urinary tract infections ?Swelling of the opening at the tip of the penis Circumcised men seem to have slightly lower rates of: ?Urinary tract infections ?Swelling of the opening at the tip of the penis ?Penis cancer ?HIV and other infections that you catch during sex ?Cervical cancer in the women they have sex with Even so, in the [place], the risks of these problems are small - even in boys and men who have not been circumcised. Plus, boys and men who are not circumcised can reduce these extra risks by: ?Cleaning their penis well ?Using condoms during sex  What are the risks of circumcision?  Risks include: ?Bleeding or infection from the surgery ?Damage to or amputation of the penis ?A chance that the doctor will cut off too much  or not enough of the foreskin ?A chance that sex won't feel as good later in life Only about 1 out of every 200 circumcisions leads to problems. There is also a chance that your health insurance won't pay for circumcision.  How is circumcision done in baby boys?  First, the baby gets medicine for pain relief. This might be a cream on the skin or a shot into the base of the penis. Next, the doctor cleans the baby's penis well. Then he or she uses special tools to cut off the foreskin. Finally, the doctor wraps a bandage (called gauze) around the baby's penis. If you have your baby circumcised, his doctor or nurse will give you instructions on how to care for him after the surgery. It is important that you follow those instructions carefully.  Places to have your son circumcised:    [facility] [phone] while you are in hospital  [facility] [phone] $244 by 4 wks  [facility] [phone] $175 by 2 wks  [facility] [phone] $250 by 7 days [facility] [phone] $269 by 4 wks  These prices sometimes change but are roughly what you can expect to pay. Please call and confirm pricing.   Circumcision is considered an elective/non-medically necessary procedure. There are many reasons parents decide to have their sons circumsized.  During the first year of life circumcised males have a reduced risk of urinary tract infections but after this year the rates between circumcised males and uncircumcised males are the same.  It is safe to have your son circumcised outside of the hospital and the places above perform them regularly.

## 2017-09-26 ENCOUNTER — Inpatient Hospital Stay (HOSPITAL_COMMUNITY)
Admission: AD | Admit: 2017-09-26 | Discharge: 2017-09-26 | Disposition: A | Discharge status: Home / Self Care | Payer: Medicaid Other | Source: Ambulatory Visit | Attending: Obstetrics and Gynecology | Admitting: Obstetrics and Gynecology

## 2017-09-26 ENCOUNTER — Encounter: Payer: Self-pay | Admitting: Advanced Practice Midwife

## 2017-09-26 ENCOUNTER — Ambulatory Visit (INDEPENDENT_AMBULATORY_CARE_PROVIDER_SITE_OTHER): Payer: Medicaid Other | Admitting: Advanced Practice Midwife

## 2017-09-26 ENCOUNTER — Encounter (HOSPITAL_COMMUNITY): Payer: Self-pay | Admitting: *Deleted

## 2017-09-26 VITALS — BP 112/74 | HR 99 | Wt 183.0 lb

## 2017-09-26 DIAGNOSIS — Z3493 Encounter for supervision of normal pregnancy, unspecified, third trimester: Secondary | ICD-10-CM

## 2017-09-26 DIAGNOSIS — O9989 Other specified diseases and conditions complicating pregnancy, childbirth and the puerperium: Secondary | ICD-10-CM | POA: Diagnosis not present

## 2017-09-26 DIAGNOSIS — O26893 Other specified pregnancy related conditions, third trimester: Secondary | ICD-10-CM | POA: Diagnosis not present

## 2017-09-26 DIAGNOSIS — Z87440 Personal history of urinary (tract) infections: Secondary | ICD-10-CM | POA: Diagnosis not present

## 2017-09-26 DIAGNOSIS — Z3400 Encounter for supervision of normal first pregnancy, unspecified trimester: Secondary | ICD-10-CM

## 2017-09-26 DIAGNOSIS — R42 Dizziness and giddiness: Secondary | ICD-10-CM | POA: Diagnosis not present

## 2017-09-26 DIAGNOSIS — B951 Streptococcus, group B, as the cause of diseases classified elsewhere: Secondary | ICD-10-CM

## 2017-09-26 DIAGNOSIS — Z3A39 39 weeks gestation of pregnancy: Secondary | ICD-10-CM | POA: Diagnosis not present

## 2017-09-26 DIAGNOSIS — O2341 Unspecified infection of urinary tract in pregnancy, first trimester: Secondary | ICD-10-CM

## 2017-09-26 DIAGNOSIS — O1203 Gestational edema, third trimester: Secondary | ICD-10-CM

## 2017-09-26 HISTORY — DX: Other specified health status: Z78.9

## 2017-09-26 LAB — URINALYSIS, ROUTINE W REFLEX MICROSCOPIC
BILIRUBIN URINE: NEGATIVE
GLUCOSE, UA: NEGATIVE mg/dL
KETONES UR: NEGATIVE mg/dL
Nitrite: NEGATIVE
PH: 7 (ref 5.0–8.0)
PROTEIN: NEGATIVE mg/dL
Specific Gravity, Urine: 1.011 (ref 1.005–1.030)

## 2017-09-26 LAB — CBC
HEMATOCRIT: 37.5 % (ref 36.0–46.0)
HEMOGLOBIN: 12.8 g/dL (ref 12.0–15.0)
MCH: 30.9 pg (ref 26.0–34.0)
MCHC: 34.1 g/dL (ref 30.0–36.0)
MCV: 90.6 fL (ref 78.0–100.0)
Platelets: 215 10*3/uL (ref 150–400)
RBC: 4.14 MIL/uL (ref 3.87–5.11)
RDW: 13.3 % (ref 11.5–15.5)
WBC: 11.7 10*3/uL — AB (ref 4.0–10.5)

## 2017-09-26 LAB — PROTEIN / CREATININE RATIO, URINE
Creatinine, Urine: 61 mg/dL
Protein Creatinine Ratio: 0.3 mg/mg{Cre} — ABNORMAL HIGH (ref 0.00–0.15)
Total Protein, Urine: 18 mg/dL

## 2017-09-26 LAB — COMPREHENSIVE METABOLIC PANEL
ALK PHOS: 146 U/L — AB (ref 38–126)
ALT: 22 U/L (ref 14–54)
AST: 22 U/L (ref 15–41)
Albumin: 3.2 g/dL — ABNORMAL LOW (ref 3.5–5.0)
Anion gap: 6 (ref 5–15)
BILIRUBIN TOTAL: 0.3 mg/dL (ref 0.3–1.2)
BUN: 14 mg/dL (ref 6–20)
CALCIUM: 9.3 mg/dL (ref 8.9–10.3)
CO2: 24 mmol/L (ref 22–32)
Chloride: 105 mmol/L (ref 101–111)
Creatinine, Ser: 0.77 mg/dL (ref 0.44–1.00)
GFR calc Af Amer: >60 mL/min (ref >60)
GFR calc non Af Amer: >60 mL/min (ref >60)
GLUCOSE: 82 mg/dL (ref 65–99)
Potassium: 4.3 mmol/L (ref 3.5–5.1)
SODIUM: 135 mmol/L (ref 135–145)
TOTAL PROTEIN: 6.8 g/dL (ref 6.5–8.1)

## 2017-09-26 NOTE — Patient Instructions (Signed)
Vaginal Delivery Vaginal delivery means that you will give birth by pushing your baby out of your birth canal (vagina). A team of health care providers will help you before, during, and after vaginal delivery. Birth experiences are unique for every woman and every pregnancy, and birth experiences vary depending on where you choose to give birth. What should I do to prepare for my baby's birth? Before your baby is born, it is important to talk with your health care provider about:  Your labor and delivery preferences. These may include: ? Medicines that you may be given. ? How you will manage your pain. This might include non-medical pain relief techniques or injectable pain relief such as epidural analgesia. ? How you and your baby will be monitored during labor and delivery. ? Who may be in the labor and delivery room with you. ? Your feelings about surgical delivery of your baby (cesarean delivery, or C-section) if this becomes necessary. ? Your feelings about receiving donated blood through an IV tube (blood transfusion) if this becomes necessary.  Whether you are able: ? To take pictures or videos of the birth. ? To eat during labor and delivery. ? To move around, walk, or change positions during labor and delivery.  What to expect after your baby is born, such as: ? Whether delayed umbilical cord clamping and cutting is offered. ? Who will care for your baby right after birth. ? Medicines or tests that may be recommended for your baby. ? Whether breastfeeding is supported in your hospital or birth center. ? How long you will be in the hospital or birth center.  How any medical conditions you have may affect your baby or your labor and delivery experience.  To prepare for your baby's birth, you should also:  Attend all of your health care visits before delivery (prenatal visits) as recommended by your health care provider. This is important.  Prepare your home for your baby's  arrival. Make sure that you have: ? Diapers. ? Baby clothing. ? Feeding equipment. ? Safe sleeping arrangements for you and your baby.  Install a car seat in your vehicle. Have your car seat checked by a certified car seat installer to make sure that it is installed safely.  Think about who will help you with your new baby at home for at least the first several weeks after delivery.  What can I expect when I arrive at the birth center or hospital? Once you are in labor and have been admitted into the hospital or birth center, your health care provider may:  Review your pregnancy history and any concerns you have.  Insert an IV tube into one of your veins. This is used to give you fluids and medicines.  Check your blood pressure, pulse, temperature, and heart rate (vital signs).  Check whether your bag of water (amniotic sac) has broken (ruptured).  Talk with you about your birth plan and discuss pain control options.  Monitoring Your health care provider may monitor your contractions (uterine monitoring) and your baby's heart rate (fetal monitoring). You may need to be monitored:  Often, but not continuously (intermittently).  All the time or for long periods at a time (continuously). Continuous monitoring may be needed if: ? You are taking certain medicines, such as medicine to relieve pain or make your contractions stronger. ? You have pregnancy or labor complications.  Monitoring may be done by:  Placing a special stethoscope or a handheld monitoring device on your abdomen to   check your baby's heartbeat, and feeling your abdomen for contractions. This method of monitoring does not continuously record your baby's heartbeat or your contractions.  Placing monitors on your abdomen (external monitors) to record your baby's heartbeat and the frequency and length of contractions. You may not have to wear external monitors all the time.  Placing monitors inside of your uterus  (internal monitors) to record your baby's heartbeat and the frequency, length, and strength of your contractions. ? Your health care provider may use internal monitors if he or she needs more information about the strength of your contractions or your baby's heart rate. ? Internal monitors are put in place by passing a thin, flexible wire through your vagina and into your uterus. Depending on the type of monitor, it may remain in your uterus or on your baby's head until birth. ? Your health care provider will discuss the benefits and risks of internal monitoring with you and will ask for your permission before inserting the monitors.  Telemetry. This is a type of continuous monitoring that can be done with external or internal monitors. Instead of having to stay in bed, you are able to move around during telemetry. Ask your health care provider if telemetry is an option for you.  Physical exam Your health care provider may perform a physical exam. This may include:  Checking whether your baby is positioned: ? With the head toward your vagina (head-down). This is most common. ? With the head toward the top of your uterus (head-up or breech). If your baby is in a breech position, your health care provider may try to turn your baby to a head-down position so you can deliver vaginally. If it does not seem that your baby can be born vaginally, your provider may recommend surgery to deliver your baby. In rare cases, you may be able to deliver vaginally if your baby is head-up (breech delivery). ? Lying sideways (transverse). Babies that are lying sideways cannot be delivered vaginally.  Checking your cervix to determine: ? Whether it is thinning out (effacing). ? Whether it is opening up (dilating). ? How low your baby has moved into your birth canal.  What are the three stages of labor and delivery?  Normal labor and delivery is divided into the following three stages: Stage 1  Stage 1 is the  longest stage of labor, and it can last for hours or days. Stage 1 includes: ? Early labor. This is when contractions may be irregular, or regular and mild. Generally, early labor contractions are more than 10 minutes apart. ? Active labor. This is when contractions get longer, more regular, more frequent, and more intense. ? The transition phase. This is when contractions happen very close together, are very intense, and may last longer than during any other part of labor.  Contractions generally feel mild, infrequent, and irregular at first. They get stronger, more frequent (about every 2-3 minutes), and more regular as you progress from early labor through active labor and transition.  Many women progress through stage 1 naturally, but you may need help to continue making progress. If this happens, your health care provider may talk with you about: ? Rupturing your amniotic sac if it has not ruptured yet. ? Giving you medicine to help make your contractions stronger and more frequent.  Stage 1 ends when your cervix is completely dilated to 4 inches (10 cm) and completely effaced. This happens at the end of the transition phase. Stage 2  Once   your cervix is completely effaced and dilated to 4 inches (10 cm), you may start to feel an urge to push. It is common for the body to naturally take a rest before feeling the urge to push, especially if you received an epidural or certain other pain medicines. This rest period may last for up to 1-2 hours, depending on your unique labor experience.  During stage 2, contractions are generally less painful, because pushing helps relieve contraction pain. Instead of contraction pain, you may feel stretching and burning pain, especially when the widest part of your baby's head passes through the vaginal opening (crowning).  Your health care provider will closely monitor your pushing progress and your baby's progress through the vagina during stage 2.  Your  health care provider may massage the area of skin between your vaginal opening and anus (perineum) or apply warm compresses to your perineum. This helps it stretch as the baby's head starts to crown, which can help prevent perineal tearing. ? In some cases, an incision may be made in your perineum (episiotomy) to allow the baby to pass through the vaginal opening. An episiotomy helps to make the opening of the vagina larger to allow more room for the baby to fit through.  It is very important to breathe and focus so your health care provider can control the delivery of your baby's head. Your health care provider may have you decrease the intensity of your pushing, to help prevent perineal tearing.  After delivery of your baby's head, the shoulders and the rest of the body generally deliver very quickly and without difficulty.  Once your baby is delivered, the umbilical cord may be cut right away, or this may be delayed for 1-2 minutes, depending on your baby's health. This may vary among health care providers, hospitals, and birth centers.  If you and your baby are healthy enough, your baby may be placed on your chest or abdomen to help maintain the baby's temperature and to help you bond with each other. Some mothers and babies start breastfeeding at this time. Your health care team will dry your baby and help keep your baby warm during this time.  Your baby may need immediate care if he or she: ? Showed signs of distress during labor. ? Has a medical condition. ? Was born too early (prematurely). ? Had a bowel movement before birth (meconium). ? Shows signs of difficulty transitioning from being inside the uterus to being outside of the uterus. If you are planning to breastfeed, your health care team will help you begin a feeding. Stage 3  The third stage of labor starts immediately after the birth of your baby and ends after you deliver the placenta. The placenta is an organ that develops  during pregnancy to provide oxygen and nutrients to your baby in the womb.  Delivering the placenta may require some pushing, and you may have mild contractions. Breastfeeding can stimulate contractions to help you deliver the placenta.  After the placenta is delivered, your uterus should tighten (contract) and become firm. This helps to stop bleeding in your uterus. To help your uterus contract and to control bleeding, your health care provider may: ? Give you medicine by injection, through an IV tube, by mouth, or through your rectum (rectally). ? Massage your abdomen or perform a vaginal exam to remove any blood clots that are left in your uterus. ? Empty your bladder by placing a thin, flexible tube (catheter) into your bladder. ? Encourage   you to breastfeed your baby. After labor is over, you and your baby will be monitored closely to ensure that you are both healthy until you are ready to go home. Your health care team will teach you how to care for yourself and your baby. This information is not intended to replace advice given to you by your health care provider. Make sure you discuss any questions you have with your health care provider. Document Released: 05/10/2008 Document Revised: 02/19/2016 Document Reviewed: 08/16/2015 Elsevier Interactive Patient Education  2018 [employer]. Edema Edema is an abnormal buildup of fluids in your bodytissues. Edema is somewhatdependent on gravity to pull the fluid to the lowest place in your body. That makes the condition more common in the legs and thighs (lower extremities). Painless swelling of the feet and ankles is common and becomes more likely as you get older. It is also common in looser tissues, like around your eyes. When the affected area is squeezed, the fluid may move out of that spot and leave a dent for a few moments. This dent is called pitting. What are the causes? There are many possible causes of edema. Eating too much salt and  being on your feet or sitting for a long time can cause edema in your legs and ankles. Hot weather may make edema worse. Common medical causes of edema include:  Heart failure.  Liver disease.  Kidney disease.  Weak blood vessels in your legs.  Cancer.  An injury.  Pregnancy.  Some medications.  Obesity.  What are the signs or symptoms? Edema is usually painless.Your skin may look swollen or shiny. How is this diagnosed? Your health care provider may be able to diagnose edema by asking about your medical history and doing a physical exam. You may need to have tests such as X-rays, an electrocardiogram, or blood tests to check for medical conditions that may cause edema. How is this treated? Edema treatment depends on the cause. If you have heart, liver, or kidney disease, you need the treatment appropriate for these conditions. General treatment may include:  Elevation of the affected body part above the level of your heart.  Compression of the affected body part. Pressure from elastic bandages or support stockings squeezes the tissues and forces fluid back into the blood vessels. This keeps fluid from entering the tissues.  Restriction of fluid and salt intake.  Use of a water pill (diuretic). These medications are appropriate only for some types of edema. They pull fluid out of your body and make you urinate more often. This gets rid of fluid and reduces swelling, but diuretics can have side effects. Only use diuretics as directed by your health care provider.  Follow these instructions at home:  Keep the affected body part above the level of your heart when you are lying down.  Do not sit still or stand for prolonged periods.  Do not put anything directly under your knees when lying down.  Do not wear constricting clothing or garters on your upper legs.  Exercise your legs to work the fluid back into your blood vessels. This may help the swelling go down.  Wear  elastic bandages or support stockings to reduce ankle swelling as directed by your health care provider.  Eat a low-salt diet to reduce fluid if your health care provider recommends it.  Only take medicines as directed by your health care provider. Contact a health care provider if:  Your edema is not responding to treatment.  You have heart, liver, or kidney disease and notice symptoms of edema.  You have edema in your legs that does not improve after elevating them.  You have sudden and unexplained weight gain. Get help right away if:  You develop shortness of breath or chest pain.  You cannot breathe when you lie down.  You develop pain, redness, or warmth in the swollen areas.  You have heart, liver, or kidney disease and suddenly get edema.  You have a fever and your symptoms suddenly get worse. This information is not intended to replace advice given to you by your health care provider. Make sure you discuss any questions you have with your health care provider. Document Released: 08/01/2005 Document Revised: 01/07/2016 Document Reviewed: 05/24/2013 Elsevier Interactive Patient Education  2017 [employer]

## 2017-09-26 NOTE — Discharge Instructions (Signed)
Preeclampsia and Eclampsia °Preeclampsia is a serious condition that develops only during pregnancy. It is also called toxemia of pregnancy. This condition causes high blood pressure along with other symptoms, such as swelling and headaches. These symptoms may develop as the condition gets worse. Preeclampsia may occur at 20 weeks of pregnancy or later. °Diagnosing and treating preeclampsia early is very important. If not treated early, it can cause serious problems for you and your baby. One problem it can lead to is eclampsia, which is a condition that causes muscle jerking or shaking (convulsions or seizures) in the mother. Delivering your baby is the best treatment for preeclampsia or eclampsia. Preeclampsia and eclampsia symptoms usually go away after your baby is born. °What are the causes? °The cause of preeclampsia is not known. °What increases the risk? °The following risk factors make you more likely to develop preeclampsia: °· Being pregnant for the first time. °· Having had preeclampsia during a past pregnancy. °· Having a family history of preeclampsia. °· Having high blood pressure. °· Being pregnant with twins or triplets. °· Being 35 or older. °· Being African-American. °· Having kidney disease or diabetes. °· Having medical conditions such as lupus or blood diseases. °· Being very overweight (obese). ° °What are the signs or symptoms? °The earliest signs of preeclampsia are: °· High blood pressure. °· Increased protein in your urine. Your health care provider will check for this at every visit before you give birth (prenatal visit). ° °Other symptoms that may develop as the condition gets worse include: °· Severe headaches. °· Sudden weight gain. °· Swelling of the hands, face, legs, and feet. °· Nausea and vomiting. °· Vision problems, such as blurred or double vision. °· Numbness in the face, arms, legs, and feet. °· Urinating less than usual. °· Dizziness. °· Slurred speech. °· Abdominal pain,  especially upper abdominal pain. °· Convulsions or seizures. ° °Symptoms generally go away after giving birth. °How is this diagnosed? °There are no screening tests for preeclampsia. Your health care provider will ask you about symptoms and check for signs of preeclampsia during your prenatal visits. You may also have tests that include: °· Urine tests. °· Blood tests. °· Checking your blood pressure. °· Monitoring your baby’s heart rate. °· Ultrasound. ° °How is this treated? °You and your health care provider will determine the treatment approach that is best for you. Treatment may include: °· Having more frequent prenatal exams to check for signs of preeclampsia, if you have an increased risk for preeclampsia. °· Bed rest. °· Reducing how much salt (sodium) you eat. °· Medicine to lower your blood pressure. °· Staying in the hospital, if your condition is severe. There, treatment will focus on controlling your blood pressure and the amount of fluids in your body (fluid retention). °· You may need to take medicine (magnesium sulfate) to prevent seizures. This medicine may be given as an injection or through an IV tube. °· Delivering your baby early, if your condition gets worse. You may have your labor started with medicine (induced), or you may have a cesarean delivery. ° °Follow these instructions at home: °Eating and drinking ° °· Drink enough fluid to keep your urine clear or pale yellow. °· Eat a healthy diet that is low in sodium. Do not add salt to your food. Check nutrition labels to see how much sodium a food or beverage contains. °· Avoid caffeine. °Lifestyle °· Do not use any products that contain nicotine or tobacco, such as cigarettes   and e-cigarettes. If you need help quitting, ask your health care provider. °· Do not use alcohol or drugs. °· Avoid stress as much as possible. Rest and get plenty of sleep. °General instructions °· Take over-the-counter and prescription medicines only as told by your  health care provider. °· When lying down, lie on your side. This keeps pressure off of your baby. °· When sitting or lying down, raise (elevate) your feet. Try putting some pillows underneath your lower legs. °· Exercise regularly. Ask your health care provider what kinds of exercise are best for you. °· Keep all follow-up and prenatal visits as told by your health care provider. This is important. °How is this prevented? °To prevent preeclampsia or eclampsia from developing during another pregnancy: °· Get proper medical care during pregnancy. Your health care provider may be able to prevent preeclampsia or diagnose and treat it early. °· Your health care provider may have you take a low-dose aspirin or a calcium supplement during your next pregnancy. °· You may have tests of your blood pressure and kidney function after giving birth. °· Maintain a healthy weight. Ask your health care provider for help managing weight gain during pregnancy. °· Work with your health care provider to manage any long-term (chronic) health conditions you have, such as diabetes or kidney problems. ° °Contact a health care provider if: °· You gain more weight than expected. °· You have headaches. °· You have nausea or vomiting. °· You have abdominal pain. °· You feel dizzy or light-headed. °Get help right away if: °· You develop sudden or severe swelling anywhere in your body. This usually happens in the legs. °· You gain 5 lbs (2.3 kg) or more during one week. °· You have severe: °? Abdominal pain. °? Headaches. °? Dizziness. °? Vision problems. °? Confusion. °? Nausea or vomiting. °· You have a seizure. °· You have trouble moving any part of your body. °· You develop numbness in any part of your body. °· You have trouble speaking. °· You have any abnormal bleeding. °· You pass out. °This information is not intended to replace advice given to you by your health care provider. Make sure you discuss any questions you have with your health  care provider. °Document Released: 07/29/2000 Document Revised: 03/29/2016 Document Reviewed: 03/07/2016 °Elsevier Interactive Patient Education © 2018 [employer] ° °

## 2017-09-26 NOTE — MAU Provider Note (Signed)
History     CSN: [id number]  Arrival date and time: 09/26/17 1636   First Provider Initiated Contact with Patient 09/26/17 1719     Chief Complaint  Patient presents with  . Dizziness   HPI Carolyn Riley is a 22 y.o. G1P0 at [redacted]w[redacted]d who presents for evaluation of blood pressure. She states her blood pressure was elevated in the office this morning. She states she started feeling lightheaded and dizzy around noon so she came in to be evaluated. She states she only ate lunch today and drank a small amount of water. Denies any headache, visual changes or epigastric pain. She denies any pain, vaginal bleeding or leaking of fluid. Reports good fetal movement.   OB History    Gravida Para Term Preterm AB Living   1             SAB TAB Ectopic Multiple Live Births                  Past Medical History:  Diagnosis Date  . Medical history non-contributory     Past Surgical History:  Procedure Laterality Date  . NO PAST SURGERIES      Family History  Problem Relation Age of Onset  . Cancer Neg Hx   . Hypertension Neg Hx   . Diabetes Neg Hx   . Asthma Neg Hx   . Birth defects Neg Hx   . Stroke Neg Hx     Social History   Tobacco Use  . Smoking status: Never Smoker  . Smokeless tobacco: Never Used  Substance Use Topics  . Alcohol use: No  . Drug use: No    Allergies: No Known Allergies  No medications prior to admission.    Review of Systems  Constitutional: Negative.  Negative for fatigue and fever.  HENT: Negative.   Respiratory: Negative.  Negative for shortness of breath.   Cardiovascular: Negative.  Negative for chest pain.  Gastrointestinal: Negative.  Negative for abdominal pain, constipation, diarrhea, nausea and vomiting.  Genitourinary: Negative.  Negative for dysuria.  Neurological: Positive for dizziness and light-headedness. Negative for headaches.   Physical Exam   Blood pressure (!) 131/55, pulse 100, resp. rate 16, height 5\' 3"  (1.6 m),  weight 187 lb (84.8 kg), last menstrual period 12/20/2016, SpO2 99 %.  Physical Exam  Nursing note and vitals reviewed. Constitutional: She is oriented to person, place, and time. She appears well-developed and well-nourished. No distress.  HENT:  Head: Normocephalic.  Eyes: Pupils are equal, round, and reactive to light.  Cardiovascular: Normal rate, regular rhythm and normal heart sounds.  Respiratory: Effort normal and breath sounds normal. No respiratory distress.  GI: Soft. Bowel sounds are normal. She exhibits no distension. There is no tenderness.  Neurological: She is alert and oriented to person, place, and time.  Skin: Skin is warm and dry.  Psychiatric: She has a normal mood and affect. Her behavior is normal. Judgment and thought content normal.   Fetal Tracing:  Baseline: 115 Variability: moderate Accels: 15x15 Decels: none  Toco: none  MAU Course  Procedures Results for orders placed or performed during the hospital encounter of 09/26/17 (from the past 24 hour(s))  Protein / creatinine ratio, urine     Status: Abnormal   Collection Time: 09/26/17  4:53 PM  Result Value Ref Range   Creatinine, Urine 61.00 mg/dL   Total Protein, Urine 18 mg/dL   Protein Creatinine Ratio 0.30 (H) 0.00 - 0.15 mg/mg[Cre]  Urinalysis, Routine  w reflex microscopic     Status: Abnormal   Collection Time: 09/26/17  4:53 PM  Result Value Ref Range   Color, Urine YELLOW YELLOW   APPearance CLOUDY (A) CLEAR   Specific Gravity, Urine 1.011 1.005 - 1.030   pH 7.0 5.0 - 8.0   Glucose, UA NEGATIVE NEGATIVE mg/dL   Hgb urine dipstick SMALL (A) NEGATIVE   Bilirubin Urine NEGATIVE NEGATIVE   Ketones, ur NEGATIVE NEGATIVE mg/dL   Protein, ur NEGATIVE NEGATIVE mg/dL   Nitrite NEGATIVE NEGATIVE   Leukocytes, UA LARGE (A) NEGATIVE   RBC / HPF 0-5 0 - 5 RBC/hpf   WBC, UA 6-30 0 - 5 WBC/hpf   Bacteria, UA RARE (A) NONE SEEN   Squamous Epithelial / LPF 6-30 (A) NONE SEEN   Mucus PRESENT   CBC      Status: Abnormal   Collection Time: 09/26/17  5:02 PM  Result Value Ref Range   WBC 11.7 (H) 4.0 - 10.5 K/uL   RBC 4.14 3.87 - 5.11 MIL/uL   Hemoglobin 12.8 12.0 - 15.0 g/dL   HCT 09.837.5 11.936.0 - 14.746.0 %   MCV 90.6 78.0 - 100.0 fL   MCH 30.9 26.0 - 34.0 pg   MCHC 34.1 30.0 - 36.0 g/dL   RDW 82.913.3 56.211.5 - 13.015.5 %   Platelets 215 150 - 400 K/uL  Comprehensive metabolic panel     Status: Abnormal   Collection Time: 09/26/17  5:02 PM  Result Value Ref Range   Sodium 135 135 - 145 mmol/L   Potassium 4.3 3.5 - 5.1 mmol/L   Chloride 105 101 - 111 mmol/L   CO2 24 22 - 32 mmol/L   Glucose, Bld 82 65 - 99 mg/dL   BUN 14 6 - 20 mg/dL   Creatinine, Ser 8.650.77 0.44 - 1.00 mg/dL   Calcium 9.3 8.9 - 78.410.3 mg/dL   Total Protein 6.8 6.5 - 8.1 g/dL   Albumin 3.2 (L) 3.5 - 5.0 g/dL   AST 22 15 - 41 U/L   ALT 22 14 - 54 U/L   Alkaline Phosphatase 146 (H) 38 - 126 U/L   Total Bilirubin 0.3 0.3 - 1.2 mg/dL   GFR calc non Af Amer >60 >60 mL/min   GFR calc Af Amer >60 >60 mL/min   Anion gap 6 5 - 15   MDM UA CBC, CMP, protein/creat ratio  Reviewed with Dr. Adrian BlackwaterStinson- ok to have patient follow up on Thursday for BP check  Assessment and Plan   1. Lightheadedness   2. GBS (group b Streptococcus) UTI complicating pregnancy, first trimester   3. Supervision of normal first pregnancy, antepartum   4. [redacted] weeks gestation of pregnancy    -Discharge home in stable condition -Preeclampsia precautions discussed -Patient advised to follow-up with [facility] on Thursday as scheduled -Patient may return to MAU as needed or if her condition were to change or worsen   Rolm BookbinderCaroline M Neill CNM 09/26/2017, 5:22 PM

## 2017-09-26 NOTE — Progress Notes (Signed)
Repeat bp 112/74. Denies any headaches or visual changes.3rd blood pressure was 115 /64 P:88 Patient to repeat blood pressure check on Thursday 09/28/17. Armandina StammerJennifer Howard RNBSN

## 2017-09-26 NOTE — Progress Notes (Signed)
   PRENATAL VISIT NOTE  Subjective:  Santiago BumpersSamantha Vanliew is a 22 y.o. G1P0 at 5164w2d being seen today for ongoing prenatal care.  She is currently monitored for the following issues for this low-risk pregnancy and has Supervision of normal first pregnancy, antepartum; GBS (group b Streptococcus) UTI complicating pregnancy, first trimester; LGSIL on Pap smear of cervix; and Back pain affecting pregnancy, antepartum on their problem list.  Patient reports rare contractions, swelling in feet for past month, no headache or visual changes.  Contractions: Irritability. Vag. Bleeding: Scant.  Movement: Present. Denies leaking of fluid.   The following portions of the patient's history were reviewed and updated as appropriate: allergies, current medications, past family history, past medical history, past social history, past surgical history and problem list. Problem list updated.  Objective:   Vitals:   09/26/17 0906 09/26/17 0917  BP: (!) 125/100 112/74  Pulse: 100 99  Weight: 183 lb (83 kg)     Fetal Status: Fetal Heart Rate (bpm): 140   Movement: Present     General:  Alert, oriented and cooperative. Patient is in no acute distress.  Skin: Skin is warm and dry. No rash noted.   Cardiovascular: Normal heart rate noted  Respiratory: Normal respiratory effort, no problems with respiration noted  Abdomen: Soft, gravid, appropriate for gestational age.  Pain/Pressure: Present     Pelvic: Cervical exam deferred        Extremities: Normal range of motion.  Edema: Moderate pitting, indentation subsides rapidly  Mental Status:  Normal mood and affect. Normal behavior. Normal judgment and thought content.   Assessment and Plan:  Pregnancy: G1P0 at 7264w2d  1. Prenatal care in third trimester     Confusing BPs.  Retook third time and was normal.       No H/A  Or visual changes      Reviewed preeclampsia with patient      Will recheck BP Thursday or PRN       Urine dip showed protein, will check  ratio  - Protein / creatinine ratio, urine  Term labor symptoms and general obstetric precautions including but not limited to vaginal bleeding, contractions, leaking of fluid and fetal movement were reviewed in detail with the patient. Please refer to After Visit Summary for other counseling recommendations.  RTO Thursday  Wynelle BourgeoisMarie Kali Ambler, CNM

## 2017-09-26 NOTE — MAU Note (Signed)
Pt was seen in office today, b/p was elevated and since she left there she has been feeling lightheaded and dizzy. They did not do blood work in the office.

## 2017-09-26 NOTE — MAU Note (Addendum)
Pt presents with c/o dizziness, SOB, & heart racing.  Denies visual disturbances, H/A, or epigastric pain.  States seen in office today, BP 126/100, retake WNL.  Instructed to be seen in MAU if required further eval.  Denies VB,but is losing mucous plug.  No LOF. Reports +FM

## 2017-09-27 LAB — PROTEIN / CREATININE RATIO, URINE
Creatinine, Urine: 233.3 mg/dL
Protein, Ur: 55.8 mg/dL
Protein/Creat Ratio: 239 mg/g creat — ABNORMAL HIGH (ref 0–200)

## 2017-09-28 ENCOUNTER — Ambulatory Visit: Payer: Medicaid Other

## 2017-09-28 VITALS — BP 120/55 | HR 69 | Wt 183.0 lb

## 2017-09-28 DIAGNOSIS — Z3490 Encounter for supervision of normal pregnancy, unspecified, unspecified trimester: Secondary | ICD-10-CM

## 2017-09-28 DIAGNOSIS — O1203 Gestational edema, third trimester: Secondary | ICD-10-CM

## 2017-09-28 NOTE — Progress Notes (Signed)
Chart reviewed - agree with RN documentation.   

## 2017-09-28 NOTE — Progress Notes (Signed)
Patient presents for BP check. Patient is 39.[redacted] weeks pregnant. Denies any recent symptoms of headache/ dizziness/ blurry vision. Patient was seen in MAU on 09-26-17 for dizziness and was cleared for discharged.  Patient BP today 120/55. Pulse 69. Patient still having swelling in feet and ankles that is 1+ edema.  Reviewed pre eclampsia symptoms with the patient and she is to return on Tuesay 10-03-17 for OB check. Armandina StammerJennifer Noemi Bellissimo RNBSN

## 2017-10-03 ENCOUNTER — Telehealth (HOSPITAL_COMMUNITY): Payer: Self-pay | Admitting: *Deleted

## 2017-10-03 ENCOUNTER — Ambulatory Visit (INDEPENDENT_AMBULATORY_CARE_PROVIDER_SITE_OTHER): Payer: Medicaid Other | Admitting: Advanced Practice Midwife

## 2017-10-03 VITALS — BP 111/60 | HR 84 | Wt 187.0 lb

## 2017-10-03 DIAGNOSIS — B951 Streptococcus, group B, as the cause of diseases classified elsewhere: Secondary | ICD-10-CM

## 2017-10-03 DIAGNOSIS — O2341 Unspecified infection of urinary tract in pregnancy, first trimester: Principal | ICD-10-CM

## 2017-10-03 DIAGNOSIS — O480 Post-term pregnancy: Secondary | ICD-10-CM | POA: Diagnosis not present

## 2017-10-03 DIAGNOSIS — Z3400 Encounter for supervision of normal first pregnancy, unspecified trimester: Secondary | ICD-10-CM

## 2017-10-03 NOTE — Telephone Encounter (Signed)
Preadmission screen  

## 2017-10-03 NOTE — Progress Notes (Signed)
Patient schedule for midnight induction on 10-07-17. Patient aware to arrive at 11:30 pm on 10-06-17. Carolyn StammerJennifer Aman Batley RNBSN

## 2017-10-03 NOTE — Progress Notes (Signed)
   PRENATAL VISIT NOTE  Subjective:  Carolyn Riley is a 22 y.o. G1P0 at 4694w2d being seen today for ongoing prenatal care.  She is currently monitored for the following issues for this low-risk pregnancy and has Supervision of normal first pregnancy, antepartum; GBS (group b Streptococcus) UTI complicating pregnancy, first trimester; LGSIL on Pap smear of cervix; and Back pain affecting pregnancy, antepartum on their problem list.  Patient reports occasional contractions.  Contractions: Irregular. Vag. Bleeding: None.  Movement: Present. Denies leaking of fluid.   The following portions of the patient's history were reviewed and updated as appropriate: allergies, current medications, past family history, past medical history, past social history, past surgical history and problem list. Problem list updated.  Objective:   Vitals:   10/03/17 1048  BP: 111/60  Pulse: 84  Weight: 187 lb (84.8 kg)    Fetal Status: Fetal Heart Rate (bpm): 122 Fundal Height: 41 cm Movement: Present  Presentation: Vertex NST EFM: Baseline: 125 bpm, Variability: Good {> 6 bpm), Accelerations: Reactive and Decelerations: Absent UC's: none AFI 11 cm  General:  Alert, oriented and cooperative. Patient is in no acute distress.  Skin: Skin is warm and dry. No rash noted.   Cardiovascular: Normal heart rate noted  Respiratory: Normal respiratory effort, no problems with respiration noted  Abdomen: Soft, gravid, appropriate for gestational age.  Pain/Pressure: Present     Pelvic: Cervical exam performed Dilation: 3.5 Effacement (%): 80 Station: -2. Membranes swept.   Extremities: Normal range of motion.  Edema: Moderate pitting, indentation subsides rapidly  Mental Status:  Normal mood and affect. Normal behavior. Normal judgment and thought content.   Assessment and Plan:  Pregnancy: G1P0 at 3694w2d  1. GBS (group b Streptococcus) UTI complicating pregnancy, first trimester - PCN in labor  2. Supervision of  normal first pregnancy, antepartum   3. Post-term pregnancy- NST reactive, AFI Nml - IOL scheduled 10/06/17 at [facility]. Orders entered. (No induction slots available 2/23, 2/24, 2/25). Considering elevated BP  X 1 and borderline PRC recommend not going over EDD.   Term labor symptoms and general obstetric precautions including but not limited to vaginal bleeding, contractions, leaking of fluid and fetal movement were reviewed in detail with the patient. Please refer to After Visit Summary for other counseling recommendations.  F/U 5 weeks for PP visit.    Dorathy KinsmanVirginia Davan Nawabi, CNM

## 2017-10-04 ENCOUNTER — Other Ambulatory Visit: Payer: Self-pay | Admitting: Advanced Practice Midwife

## 2017-10-04 ENCOUNTER — Other Ambulatory Visit: Payer: Self-pay | Admitting: Family Medicine

## 2017-10-06 ENCOUNTER — Encounter (HOSPITAL_COMMUNITY): Payer: Self-pay | Admitting: *Deleted

## 2017-10-06 ENCOUNTER — Inpatient Hospital Stay (HOSPITAL_COMMUNITY)
Admission: AD | Admit: 2017-10-06 | Discharge: 2017-10-08 | DRG: 807 | Disposition: A | Discharge status: Home / Self Care | Payer: Medicaid Other | Source: Ambulatory Visit | Attending: Obstetrics and Gynecology | Admitting: Obstetrics and Gynecology

## 2017-10-06 ENCOUNTER — Inpatient Hospital Stay (HOSPITAL_COMMUNITY): Payer: Medicaid Other | Admitting: Anesthesiology

## 2017-10-06 DIAGNOSIS — Z3A40 40 weeks gestation of pregnancy: Secondary | ICD-10-CM

## 2017-10-06 DIAGNOSIS — O2341 Unspecified infection of urinary tract in pregnancy, first trimester: Secondary | ICD-10-CM

## 2017-10-06 DIAGNOSIS — O99824 Streptococcus B carrier state complicating childbirth: Secondary | ICD-10-CM | POA: Diagnosis present

## 2017-10-06 DIAGNOSIS — O480 Post-term pregnancy: Secondary | ICD-10-CM | POA: Diagnosis not present

## 2017-10-06 DIAGNOSIS — B951 Streptococcus, group B, as the cause of diseases classified elsewhere: Secondary | ICD-10-CM

## 2017-10-06 DIAGNOSIS — Z3400 Encounter for supervision of normal first pregnancy, unspecified trimester: Secondary | ICD-10-CM

## 2017-10-06 LAB — CBC
HCT: 40.1 % (ref 36.0–46.0)
Hemoglobin: 13.7 g/dL (ref 12.0–15.0)
MCH: 30.8 pg (ref 26.0–34.0)
MCHC: 34.2 g/dL (ref 30.0–36.0)
MCV: 90.1 fL (ref 78.0–100.0)
PLATELETS: 233 10*3/uL (ref 150–400)
RBC: 4.45 MIL/uL (ref 3.87–5.11)
RDW: 13.1 % (ref 11.5–15.5)
WBC: 16.3 10*3/uL — ABNORMAL HIGH (ref 4.0–10.5)

## 2017-10-06 LAB — TYPE AND SCREEN
ABO/RH(D): O POS
Antibody Screen: NEGATIVE

## 2017-10-06 LAB — ABO/RH: ABO/RH(D): O POS

## 2017-10-06 LAB — OB RESULTS CONSOLE GBS: STREP GROUP B AG: POSITIVE

## 2017-10-06 MED ORDER — OXYCODONE-ACETAMINOPHEN 5-325 MG PO TABS: 2.0000 | ORAL_TABLET | ORAL | Status: DC | PRN

## 2017-10-06 MED ORDER — SIMETHICONE 80 MG PO CHEW: 80.0000 mg | CHEWABLE_TABLET | ORAL | Status: DC | PRN

## 2017-10-06 MED ORDER — FLEET ENEMA 7-19 GM/118ML RE ENEM: 1.0000 | ENEMA | RECTAL | Status: DC | PRN

## 2017-10-06 MED ORDER — LACTATED RINGERS IV SOLN
INTRAVENOUS | Status: DC
  Administered 2017-10-06 (×2): via INTRAVENOUS

## 2017-10-06 MED ORDER — PHENYLEPHRINE 40 MCG/ML (10ML) SYRINGE FOR IV PUSH (FOR BLOOD PRESSURE SUPPORT)
PREFILLED_SYRINGE | INTRAVENOUS | Status: AC
  Filled 2017-10-06: qty 10

## 2017-10-06 MED ORDER — SOD CITRATE-CITRIC ACID 500-334 MG/5ML PO SOLN: 30.0000 mL | ORAL | Status: DC | PRN

## 2017-10-06 MED ORDER — DIPHENHYDRAMINE HCL 50 MG/ML IJ SOLN: 12.5000 mg | INTRAMUSCULAR | Status: DC | PRN

## 2017-10-06 MED ORDER — ONDANSETRON HCL 4 MG/2ML IJ SOLN
4.0000 mg | Freq: Four times a day (QID) | INTRAMUSCULAR | Status: DC | PRN
  Administered 2017-10-06: 4 mg via INTRAVENOUS

## 2017-10-06 MED ORDER — COCONUT OIL OIL
1.0000 "application " | TOPICAL_OIL | Status: DC | PRN
  Administered 2017-10-08: 1 via TOPICAL
  Filled 2017-10-06: qty 120

## 2017-10-06 MED ORDER — LIDOCAINE HCL (PF) 1 % IJ SOLN: 30.0000 mL | INTRAMUSCULAR | Status: DC | PRN

## 2017-10-06 MED ORDER — BENZOCAINE-MENTHOL 20-0.5 % EX AERO
1.0000 "application " | INHALATION_SPRAY | CUTANEOUS | Status: DC | PRN
  Administered 2017-10-08: 1 via TOPICAL
  Filled 2017-10-06 (×2): qty 56

## 2017-10-06 MED ORDER — OXYTOCIN BOLUS FROM INFUSION: 500.0000 mL | Freq: Once | INTRAVENOUS | Status: DC

## 2017-10-06 MED ORDER — FENTANYL CITRATE (PF) 100 MCG/2ML IJ SOLN
INTRAMUSCULAR | Status: AC
Start: 2017-10-06 — End: 2017-10-06
  Filled 2017-10-06: qty 2

## 2017-10-06 MED ORDER — ONDANSETRON HCL 4 MG/2ML IJ SOLN
4.0000 mg | Freq: Four times a day (QID) | INTRAMUSCULAR | Status: DC | PRN
  Filled 2017-10-06: qty 2

## 2017-10-06 MED ORDER — ONDANSETRON HCL 4 MG/2ML IJ SOLN: 4.0000 mg | INTRAMUSCULAR | Status: DC | PRN

## 2017-10-06 MED ORDER — LACTATED RINGERS IV SOLN: 500.0000 mL | INTRAVENOUS | Status: DC | PRN

## 2017-10-06 MED ORDER — PRENATAL MULTIVITAMIN CH
1.0000 | ORAL_TABLET | Freq: Every day | ORAL | Status: DC
  Filled 2017-10-06: qty 1

## 2017-10-06 MED ORDER — LACTATED RINGERS IV SOLN
500.0000 mL | Freq: Once | INTRAVENOUS | Status: AC
  Administered 2017-10-06: 500 mL via INTRAVENOUS

## 2017-10-06 MED ORDER — ACETAMINOPHEN 325 MG PO TABS
650.0000 mg | ORAL_TABLET | ORAL | Status: DC | PRN
  Administered 2017-10-06 (×2): 650 mg via ORAL
  Filled 2017-10-06 (×2): qty 2

## 2017-10-06 MED ORDER — SODIUM CHLORIDE 0.9 % IV SOLN: 5.0000 10*6.[IU] | Freq: Once | INTRAVENOUS | Status: DC

## 2017-10-06 MED ORDER — FENTANYL 2.5 MCG/ML BUPIVACAINE 1/10 % EPIDURAL INFUSION (WH - ANES)
INTRAMUSCULAR | Status: AC
  Filled 2017-10-06: qty 100

## 2017-10-06 MED ORDER — SODIUM CHLORIDE 0.9 % IV SOLN
5.0000 10*6.[IU] | Freq: Once | INTRAVENOUS | Status: AC
  Administered 2017-10-06: 5 10*6.[IU] via INTRAVENOUS
  Filled 2017-10-06: qty 5

## 2017-10-06 MED ORDER — TETANUS-DIPHTH-ACELL PERTUSSIS 5-2.5-18.5 LF-MCG/0.5 IM SUSP: 0.5000 mL | Freq: Once | INTRAMUSCULAR | Status: DC

## 2017-10-06 MED ORDER — PENICILLIN G POT IN DEXTROSE 60000 UNIT/ML IV SOLN: 3.0000 10*6.[IU] | INTRAVENOUS | Status: DC

## 2017-10-06 MED ORDER — LIDOCAINE HCL (PF) 1 % IJ SOLN
30.0000 mL | INTRAMUSCULAR | Status: DC | PRN
  Filled 2017-10-06: qty 30

## 2017-10-06 MED ORDER — LACTATED RINGERS IV SOLN: INTRAVENOUS | Status: DC

## 2017-10-06 MED ORDER — OXYTOCIN BOLUS FROM INFUSION
500.0000 mL | Freq: Once | INTRAVENOUS | Status: AC
  Administered 2017-10-06: 500 mL via INTRAVENOUS

## 2017-10-06 MED ORDER — SENNOSIDES-DOCUSATE SODIUM 8.6-50 MG PO TABS
2.0000 | ORAL_TABLET | ORAL | Status: DC
  Administered 2017-10-07: 2 via ORAL
  Filled 2017-10-06 (×2): qty 2

## 2017-10-06 MED ORDER — OXYCODONE-ACETAMINOPHEN 5-325 MG PO TABS: 1.0000 | ORAL_TABLET | ORAL | Status: DC | PRN

## 2017-10-06 MED ORDER — OXYTOCIN 40 UNITS IN LACTATED RINGERS INFUSION - SIMPLE MED
1.0000 m[IU]/min | INTRAVENOUS | Status: DC
Start: 2017-10-06 — End: 2017-10-06
  Administered 2017-10-06: 2 m[IU]/min via INTRAVENOUS
  Administered 2017-10-06: 1 m[IU]/min via INTRAVENOUS
  Filled 2017-10-06: qty 1000

## 2017-10-06 MED ORDER — OXYTOCIN 40 UNITS IN LACTATED RINGERS INFUSION - SIMPLE MED: 2.5000 [IU]/h | INTRAVENOUS | Status: DC

## 2017-10-06 MED ORDER — ONDANSETRON HCL 4 MG/2ML IJ SOLN: 4.0000 mg | Freq: Four times a day (QID) | INTRAMUSCULAR | Status: DC | PRN

## 2017-10-06 MED ORDER — ACETAMINOPHEN 325 MG PO TABS
650.0000 mg | ORAL_TABLET | ORAL | Status: DC | PRN
  Administered 2017-10-08: 650 mg via ORAL
  Filled 2017-10-06: qty 2

## 2017-10-06 MED ORDER — PHENYLEPHRINE 40 MCG/ML (10ML) SYRINGE FOR IV PUSH (FOR BLOOD PRESSURE SUPPORT)
80.0000 ug | PREFILLED_SYRINGE | INTRAVENOUS | Status: DC | PRN
  Filled 2017-10-06: qty 5

## 2017-10-06 MED ORDER — OXYTOCIN 40 UNITS IN LACTATED RINGERS INFUSION - SIMPLE MED: 1.0000 m[IU]/min | INTRAVENOUS | Status: DC

## 2017-10-06 MED ORDER — FENTANYL CITRATE (PF) 100 MCG/2ML IJ SOLN: 50.0000 ug | INTRAMUSCULAR | Status: DC | PRN

## 2017-10-06 MED ORDER — TERBUTALINE SULFATE 1 MG/ML IJ SOLN: 0.2500 mg | Freq: Once | INTRAMUSCULAR | Status: DC | PRN

## 2017-10-06 MED ORDER — SODIUM CHLORIDE 0.9 % IV SOLN: 2.0000 g | Freq: Four times a day (QID) | INTRAVENOUS | Status: DC

## 2017-10-06 MED ORDER — ZOLPIDEM TARTRATE 5 MG PO TABS: 5.0000 mg | ORAL_TABLET | Freq: Every evening | ORAL | Status: DC | PRN

## 2017-10-06 MED ORDER — DIPHENHYDRAMINE HCL 25 MG PO CAPS: 25.0000 mg | ORAL_CAPSULE | Freq: Four times a day (QID) | ORAL | Status: DC | PRN

## 2017-10-06 MED ORDER — PENICILLIN G POT IN DEXTROSE 60000 UNIT/ML IV SOLN
3.0000 10*6.[IU] | INTRAVENOUS | Status: DC
  Administered 2017-10-06: 3 10*6.[IU] via INTRAVENOUS
  Filled 2017-10-06 (×4): qty 50

## 2017-10-06 MED ORDER — IBUPROFEN 600 MG PO TABS
600.0000 mg | ORAL_TABLET | Freq: Four times a day (QID) | ORAL | Status: DC
  Administered 2017-10-07 – 2017-10-08 (×5): 600 mg via ORAL
  Filled 2017-10-06 (×7): qty 1

## 2017-10-06 MED ORDER — FENTANYL CITRATE (PF) 100 MCG/2ML IJ SOLN
100.0000 ug | INTRAMUSCULAR | Status: DC | PRN
  Administered 2017-10-06: 100 ug via INTRAVENOUS

## 2017-10-06 MED ORDER — WITCH HAZEL-GLYCERIN EX PADS: 1.0000 "application " | MEDICATED_PAD | CUTANEOUS | Status: DC | PRN

## 2017-10-06 MED ORDER — DIBUCAINE 1 % RE OINT: 1.0000 "application " | TOPICAL_OINTMENT | RECTAL | Status: DC | PRN

## 2017-10-06 MED ORDER — ACETAMINOPHEN 325 MG PO TABS: 650.0000 mg | ORAL_TABLET | ORAL | Status: DC | PRN

## 2017-10-06 MED ORDER — OXYTOCIN 40 UNITS IN LACTATED RINGERS INFUSION - SIMPLE MED
2.5000 [IU]/h | INTRAVENOUS | Status: DC
  Administered 2017-10-06: 2.5 [IU]/h via INTRAVENOUS

## 2017-10-06 MED ORDER — TERBUTALINE SULFATE 1 MG/ML IJ SOLN
0.2500 mg | Freq: Once | INTRAMUSCULAR | Status: AC | PRN
  Administered 2017-10-06: 0.25 mg via SUBCUTANEOUS
  Filled 2017-10-06: qty 1

## 2017-10-06 MED ORDER — ONDANSETRON HCL 4 MG PO TABS
4.0000 mg | ORAL_TABLET | ORAL | Status: DC | PRN
Start: 2017-10-06 — End: 2017-10-08

## 2017-10-06 MED ORDER — LIDOCAINE HCL (PF) 1 % IJ SOLN
INTRAMUSCULAR | Status: DC | PRN
  Administered 2017-10-06 (×2): 4 mL via EPIDURAL

## 2017-10-06 MED ORDER — FENTANYL 2.5 MCG/ML BUPIVACAINE 1/10 % EPIDURAL INFUSION (WH - ANES)
13.0000 mL/h | INTRAMUSCULAR | Status: DC | PRN
  Administered 2017-10-06: 13 mL/h via EPIDURAL

## 2017-10-06 NOTE — Anesthesia Pain Management Evaluation Note (Signed)
  CRNA Pain Management Visit Note  Patient: Carolyn BumpersSamantha Brechtel, 22 y.o., female  "Hello I am a member of the anesthesia team at [facility]. We have an anesthesia team available at all times to provide care throughout the hospital, including epidural management and anesthesia for C-section. I don't know your plan for the delivery whether it a natural birth, water birth, IV sedation, nitrous supplementation, doula or epidural, but we want to meet your pain goals."   1.Was your pain managed to your expectations on prior hospitalizations?   No prior hospitalizations  2.What is your expectation for pain management during this hospitalization?     Epidural  3.How can we help you reach that goal? Epidural in place  Record the patient's initial score and the patient's pain goal.   Pain: Patient sleeping - unable to assess  Pain Goal: Patient sleeping - unable to assess [facility] wants you to be able to say your pain was always managed very well.  Franklin Baumbach 10/06/2017

## 2017-10-06 NOTE — Progress Notes (Signed)
Currently pushing. Patient with poor effort due to numbness from epidural. Small changes being made. She does not want to turn off epidural at this time. Continue pushing. Pitocin restarted. Category 2 tracing.   Of note, patient had expectations of quick labor so getting frustrated with long pushing course. Has only been pushing for 45 min.   Caryl AdaJazma Phelps, DO OB Fellow Center for [facility], [facility]

## 2017-10-06 NOTE — Progress Notes (Signed)
Labor Progress Note  Carolyn BumpersSamantha Riley is a 22 y.o. G1P0 at 7952w5d  admitted for active labor  S: Doing well. Resting with epidural   O:  BP 125/72   Pulse 65   Temp 98.5 F (36.9 C) (Oral)   Resp 18   Ht 5\' 3"  (1.6 m)   LMP 12/20/2016   BMI 33.13 kg/m   No intake/output data recorded.  FHT:  FHR: 120 bpm, variability: moderate,  accelerations:  Present,  decelerations:  Absent UC:   regular, every 2-4 minutes via palpation SVE:   Dilation: 7 Effacement (%): 100 Station: -1 Exam by:: Dr. Doroteo GlassmanPhelps AROM: clear 1439   Labs: Lab Results  Component Value Date   WBC 16.3 (H) 10/06/2017   HGB 13.7 10/06/2017   HCT 40.1 10/06/2017   MCV 90.1 10/06/2017   PLT 233 10/06/2017    Assessment / Plan: 22 y.o. G1P0 8252w5d in active labor Spontaneous labor, progressing normally  Labor: Progressing normally but will augment with Pitocin since patient has had no change since admission. AROM'd and IUPC placed since contraction pattern hard to discern  Fetal Wellbeing:  Category I Pain Control:  Epidural Anticipated MOD:  NSVD  Expectant management   Carolyn AdaJazma Alannie Amodio, DO OB Fellow Center for [facility], [facility]

## 2017-10-06 NOTE — Anesthesia Procedure Notes (Signed)
Epidural Patient location during procedure: OB Start time: 10/06/2017 10:44 AM  Staffing Anesthesiologist: Mal AmabileFoster, Niko Jakel, MD Performed: anesthesiologist   Preanesthetic Checklist Completed: patient identified, site marked, surgical consent, pre-op evaluation, timeout performed, IV checked, risks and benefits discussed and monitors and equipment checked  Epidural Patient position: sitting Prep: site prepped and draped and DuraPrep Patient monitoring: continuous pulse ox and blood pressure Approach: midline Location: L3-L4 Injection technique: LOR air  Needle:  Needle type: Tuohy  Needle gauge: 17 G Needle length: 9 cm and 9 Needle insertion depth: 6 cm Catheter type: closed end flexible Catheter size: 19 Gauge Catheter at skin depth: 11 cm Test dose: negative and Other  Assessment Events: blood not aspirated, injection not painful, no injection resistance, negative IV test and no paresthesia  Additional Notes Patient identified. Risks and benefits discussed including failed block, incomplete  Pain control, post dural puncture headache, nerve damage, paralysis, blood pressure Changes, nausea, vomiting, reactions to medications-both toxic and allergic and post Partum back pain. All questions were answered. Patient expressed understanding and wished to proceed. Sterile technique was used throughout procedure. Epidural site was Dressed with sterile barrier dressing. No paresthesias, signs of intravascular injection Or signs of intrathecal spread were encountered.  Patient was more comfortable after the epidural was dosed. Please see RN's note for documentation of vital signs and FHR which are stable.

## 2017-10-06 NOTE — MAU Note (Signed)
Pt. Here due to contractions, contractions started this morning around 0700.  Contracting every 3-5 minutes, positive for fetal movement, denies sudden gush of fluid, positive for bloody show. EFM applied - FHR 120s, toco applied - contraction palpated.

## 2017-10-06 NOTE — H&P (Signed)
Obstetric History and Physical  [name] is a 22 y.o. G1P0 with IUP at 6282w5d presenting for SOL. Patient states she has been having  regular, every 3-4 minutes contractions, minimal vaginal bleeding, intact membranes, with active fetal movement.    Prenatal Course Source of Care: [facility] Dating: By 17wk US --->  Estimated Date of Delivery: 10/01/17 Pregnancy complications or risks: Patient Active Problem List   Diagnosis Date Noted  . Back pain affecting pregnancy, antepartum 07/10/2017  . GBS (group b Streptococcus) UTI complicating pregnancy, first trimester 04/19/2017  . LGSIL on Pap smear of cervix 04/19/2017  . Supervision of normal first pregnancy, antepartum 04/13/2017   She plans to breastfeed She is undecided for postpartum contraception.   Sono:   @[redacted]w[redacted]d , CWD, normal anatomy, cephalic presentation, posterior placenta, 1509g, 70% EFW  Prenatal labs and studies: ABO, Rh: O/Positive/-- (08/30 0959) Antibody: Negative (08/30 0959) Rubella: 2.00 (08/30 0959) RPR: Non Reactive (11/26 0905)  HBsAg: Negative (08/30 0959)  HIV: Non Reactive (11/26 0905)  GBS: Positive 2 hr Glucola  normal Genetic screening normal Anatomy US normal  Prenatal Transfer Tool  Maternal Diabetes: No Genetic Screening: Normal Maternal Ultrasounds/Referrals: Normal Fetal Ultrasounds or other Referrals:  None Maternal Substance Abuse:  No Significant Maternal Medications:  None Significant Maternal Lab Results: Lab values include: Group B Strep positive  Past Medical History:  Diagnosis Date  . Medical history non-contributory     Past Surgical History:  Procedure Laterality Date  . NO PAST SURGERIES      OB History  Gravida Para Term Preterm AB Living  1            SAB TAB Ectopic Multiple Live Births               # Outcome Date GA Lbr Len/2nd Weight Sex Delivery Anes PTL Lv  1 Current               Social History   Socioeconomic History  . Marital status: Single     Spouse name: Not on file  . Number of children: Not on file  . Years of education: Not on file  . Highest education level: Not on file  Social Needs  . Financial resource strain: Not on file  . Food insecurity - worry: Not on file  . Food insecurity - inability: Not on file  . Transportation needs - medical: Not on file  . Transportation needs - non-medical: Not on file  Occupational History  . Not on file  Tobacco Use  . Smoking status: Never Smoker  . Smokeless tobacco: Never Used  Substance and Sexual Activity  . Alcohol use: No  . Drug use: No  . Sexual activity: Yes  Other Topics Concern  . Not on file  Social History Narrative  . Not on file    Family History  Problem Relation Age of Onset  . Cancer Neg Hx   . Hypertension Neg Hx   . Diabetes Neg Hx   . Asthma Neg Hx   . Birth defects Neg Hx   . Stroke Neg Hx     Medications Prior to Admission  Medication Sig Dispense Refill Last Dose  . Prenatal Vit-Fe Fumarate-FA (PRENATAL MULTIVITAMIN) TABS tablet Take 1 tablet by mouth daily at 12 noon.   09/26/2017 at Unknown time    No Known Allergies  Review of Systems: Negative except for what is mentioned in HPI.  Physical Exam: BP 137/73 (BP Location: Right Arm)  Pulse 81   Temp 98.3 F (36.8 C) (Oral)   Resp 20   Ht 5\' 3"  (1.6 m)   LMP 12/20/2016   BMI 33.13 kg/m  CONSTITUTIONAL: Well-developed, well-nourished female in no acute distress.  HENT:  Normocephalic, atraumatic, External right and left ear normal. Oropharynx is clear and moist EYES: Conjunctivae and EOM are normal. Pupils are equal, round, and reactive to light. No scleral icterus.  NECK: Normal range of motion, supple, no masses SKIN: Skin is warm and dry. No rash noted. Not diaphoretic. No erythema. No pallor. NEUROLOGIC: Alert and oriented to person, place, and time. Normal reflexes, muscle tone coordination. No cranial nerve deficit noted. PSYCHIATRIC: Normal mood and affect. Normal behavior.  Normal judgment and thought content. CARDIOVASCULAR: Normal heart rate noted, regular rhythm RESPIRATORY: Effort and breath sounds normal, no problems with respiration noted ABDOMEN: Soft, nontender, nondistended, gravid. MUSCULOSKELETAL: Normal range of motion. No edema and no tenderness. 2+ distal pulses.  Cervical Exam: Dilatation 7cm   Effacement 90%   Station -1   Presentation: cephalic FHT:  Baseline rate 120 bpm   Variability moderate  Accelerations present   Decelerations none Contractions: Every 2-3 mins   Pertinent Labs/Studies:   Results for orders placed or performed during the hospital encounter of 10/06/17 (from the past 24 hour(s))  CBC     Status: Abnormal   Collection Time: 10/06/17  9:38 AM  Result Value Ref Range   WBC 16.3 (H) 4.0 - 10.5 K/uL   RBC 4.45 3.87 - 5.11 MIL/uL   Hemoglobin 13.7 12.0 - 15.0 g/dL   HCT 40.9 81.1 - 91.4 %   MCV 90.1 78.0 - 100.0 fL   MCH 30.8 26.0 - 34.0 pg   MCHC 34.2 30.0 - 36.0 g/dL   RDW 78.2 95.6 - 21.3 %   Platelets 233 150 - 400 K/uL  Type and screen [facility]     Status: None   Collection Time: 10/06/17  9:38 AM  Result Value Ref Range   ABO/RH(D) O POS    Antibody Screen NEG    Sample Expiration      10/09/2017 Performed at [facility], [address], [place], [place] [address]   ABO/Rh     Status: None   Collection Time: 10/06/17  9:38 AM  Result Value Ref Range   ABO/RH(D)      O POS Performed at [facility], [address], [place], [place] [address]     Assessment : Carolyn Riley is a 22 y.o. G1P0 at [redacted]w[redacted]d being admitted for labor.   Plan: Labor: Expectant management. Augmentation as needed. Will AROM once she gets GBS prophylaxis Plan for epidural FWB: Reassuring fetal heart tracing. GBS positive in urine - PCN Delivery plan: Hopeful for vaginal delivery   Caryl Ada, DO OB Fellow Faculty Practice, [facility] 10/06/2017, 9:25 AM

## 2017-10-06 NOTE — Anesthesia Preprocedure Evaluation (Signed)
Anesthesia Evaluation  Patient identified by MRN, date of birth, ID band Patient awake    Reviewed: Allergy & Precautions, H&P , Patient's Chart, lab work & pertinent test results  Airway Mallampati: II  TM Distance: >3 FB Neck ROM: full    Dental no notable dental hx. (+) Teeth Intact   Pulmonary neg pulmonary ROS,    Pulmonary exam normal breath sounds clear to auscultation       Cardiovascular negative cardio ROS Normal cardiovascular exam Rhythm:regular Rate:Normal     Neuro/Psych negative neurological ROS  negative psych ROS   GI/Hepatic negative GI ROS, Neg liver ROS,   Endo/Other  Obesity  Renal/GU negative Renal ROS  negative genitourinary   Musculoskeletal   Abdominal   Peds  Hematology negative hematology ROS (+)   Anesthesia Other Findings   Reproductive/Obstetrics (+) Pregnancy                             Anesthesia Physical Anesthesia Plan  ASA: II  Anesthesia Plan: Epidural   Post-op Pain Management:    Induction:   PONV Risk Score and Plan:   Airway Management Planned:   Additional Equipment:   Intra-op Plan:   Post-operative Plan:   Informed Consent: I have reviewed the patients History and Physical, chart, labs and discussed the procedure including the risks, benefits and alternatives for the proposed anesthesia with the patient or authorized representative who has indicated his/her understanding and acceptance.       Plan Discussed with: Anesthesiologist  Anesthesia Plan Comments:         Anesthesia Quick Evaluation  

## 2017-10-07 ENCOUNTER — Other Ambulatory Visit: Payer: Self-pay

## 2017-10-07 ENCOUNTER — Inpatient Hospital Stay (HOSPITAL_COMMUNITY): Admission: RE | Admit: 2017-10-07 | Payer: Medicaid Other | Source: Ambulatory Visit

## 2017-10-07 LAB — RPR: RPR: NONREACTIVE

## 2017-10-07 MED ORDER — TETANUS-DIPHTH-ACELL PERTUSSIS 5-2.5-18.5 LF-MCG/0.5 IM SUSP
0.5000 mL | Freq: Once | INTRAMUSCULAR | Status: AC
  Administered 2017-10-07: 0.5 mL via INTRAMUSCULAR
  Filled 2017-10-07: qty 0.5

## 2017-10-07 NOTE — Progress Notes (Signed)
Post Partum Day #1 Subjective: no complaints, up ad lib and tolerating PO; breastfeeding going well; considering IUD for PP contraception  Objective: Blood pressure (!) 102/48, pulse 66, temperature 99.6 F (37.6 C), resp. rate 20, height 5\' 3"  (1.6 m), weight 84.8 kg (187 lb), last menstrual period 12/20/2016, unknown if currently breastfeeding.  Physical Exam:  General: alert, cooperative and no distress Lochia: appropriate Uterine Fundus: firm DVT Evaluation: No evidence of DVT seen on physical exam.  Recent Labs    10/06/17 0938  HGB 13.7  HCT 40.1    Assessment/Plan: Plan for discharge tomorrow   LOS: 1 day   Cam HaiSHAW, KIMBERLY CNM 10/07/2017, 7:45 AM

## 2017-10-07 NOTE — Anesthesia Postprocedure Evaluation (Signed)
Anesthesia Post Note  Patient: Carolyn BumpersSamantha Haviland  Procedure(s) Performed: AN AD HOC LABOR EPIDURAL     Anesthesia Type: Epidural Level of consciousness: awake and alert Pain management: pain level controlled Vital Signs Assessment: post-procedure vital signs reviewed and stable Respiratory status: spontaneous breathing Postop Assessment: no headache, patient able to bend at knees, no backache, no apparent nausea or vomiting, epidural receding and adequate PO intake Anesthetic complications: no    Last Vitals:  Vitals:   10/06/17 2235 10/07/17 0230  BP: (!) 119/49 (!) 102/48  Pulse: 66 66  Resp: 18 20  Temp: 37.4 C 37.6 C    Last Pain:  Vitals:   10/06/17 2235  TempSrc: Oral  PainSc:    Pain Goal: Patients Stated Pain Goal: 5 (10/06/17 0945)               Salome ArntSterling, Jowanda Heeg Marie

## 2017-10-07 NOTE — Lactation Note (Signed)
This note was copied from a baby's chart. Lactation Consultation Note Baby 6 hrs old. Mom having difficulty latching d/t baby popping off and on. Mom has tiny nipples on bulbous areola. Cone shaped breast. Breast tight! Areola not compressible. Breast massage, hand expressed collected 2 ml colostrum. Taught hand expression, shells and hand pump. Encouraged to pre-pump before application of NS. Mom demonstrated application of NS. Taught FOB to assist holding and breast massage w/mom's approval. Encouraged football hold to control baby latching.  Newborn feeding habits, behavior, STS, I&O, cluster feeding, supply and demand discussed. Mom and FOB nervous about handling baby, afraid of hurting baby.  Mom encouraged to feed baby 8-12 times/24 hours and with feeding cues. Encouraged to call for assistance or questions. WH/LC brochure given w/resources, support groups and LC services. Patient Name: Carolyn Santiago BumpersSamantha Schlagel BJYNW'GToday's Date: 10/07/2017 Reason for consult: Initial assessment   Maternal Data Has patient been taught Hand Expression?: Yes Does the patient have breastfeeding experience prior to this delivery?: No  Feeding Feeding Type: Breast Fed Length of feed: 10 min  LATCH Score Latch: Repeated attempts needed to sustain latch, nipple held in mouth throughout feeding, stimulation needed to elicit sucking reflex.  Audible Swallowing: A few with stimulation  Type of Nipple: Flat  Comfort (Breast/Nipple): Soft / non-tender  Hold (Positioning): Full assist, staff holds infant at breast  LATCH Score: 5  Interventions Interventions: Breast feeding basics reviewed;Assisted with latch;Breast compression;Shells;Skin to skin;Adjust position;Breast massage;Support pillows;Hand pump;Hand express;Position options;Pre-pump if needed;Expressed milk  Lactation Tools Discussed/Used Tools: Shells;Pump;Flanges;Nipple Shields Nipple shield size: 16 Flange Size: 21 Shell Type: Inverted Breast  pump type: Manual Pump Review: Setup, frequency, and cleaning;Milk Storage Initiated by:: Peri JeffersonL. Cyntha Brickman RN IBCLC Date initiated:: 10/07/17   Consult Status Consult Status: Follow-up Date: 10/07/17 Follow-up type: In-patient    Lorah Kalina, Diamond NickelLAURA G 10/07/2017, 1:24 AM

## 2017-10-07 NOTE — Lactation Note (Signed)
This note was copied from a baby's chart. Lactation Consultation Note: Called to assist mom with spoon feeding Colostrum.Baby fussy and rooting Suggested latching baby and mom agreeable. Placed Colostrum in NS. Mom needs assist with basics. Was able to correctly place NS on breast. Reviewed holding breast throughout the feeding and keeping baby close to the breast. Encouragement given. Dad is very helpful. No questions at present. To call for assist prn  Patient Name: Carolyn [name] ZOXWR'UToday's Date: 10/07/2017 Reason for consult: Follow-up assessment   Maternal Data Formula Feeding for Exclusion: Yes Reason for exclusion: Mother's choice to formula and breast feed on admission Has patient been taught Hand Expression?: Yes Does the patient have breastfeeding experience prior to this delivery?: No  Feeding Feeding Type: Breast Fed  LATCH Score Latch: Grasps breast easily, tongue down, lips flanged, rhythmical sucking.  Audible Swallowing: A few with stimulation  Type of Nipple: Flat  Comfort (Breast/Nipple): Soft / non-tender  Hold (Positioning): Assistance needed to correctly position infant at breast and maintain latch.  LATCH Score: 7  Interventions Interventions: Breast feeding basics reviewed;Hand express;Breast compression;Assisted with latch  Lactation Tools Discussed/Used Tools: Nipple Shields Nipple shield size: 16 Shell Type: Inverted Breast pump type: Manual   Consult Status Consult Status: Follow-up Date: 10/08/17 Follow-up type: In-patient    Pamelia HoitWeeks, Rony Ratz D 10/07/2017, 9:48 AM

## 2017-10-08 MED ORDER — IBUPROFEN 600 MG PO TABS: 600.0000 mg | ORAL_TABLET | Freq: Four times a day (QID) | ORAL | 0 refills | Status: DC

## 2017-10-08 NOTE — Discharge Instructions (Signed)
Postpartum Care After Vaginal Delivery °The period of time right after you deliver your newborn is called the postpartum period. °What kind of medical care will I receive? °· You may continue to receive fluids and medicines through an IV tube inserted into one of your veins. °· If an incision was made near your vagina (episiotomy) or if you had some vaginal tearing during delivery, cold compresses may be placed on your episiotomy or your tear. This helps to reduce pain and swelling. °· You may be given a squirt bottle to use when you go to the bathroom. You may use this until you are comfortable wiping as usual. To use the squirt bottle, follow these steps: °? Before you urinate, fill the squirt bottle with warm water. Do not use hot water. °? After you urinate, while you are sitting on the toilet, use the squirt bottle to rinse the area around your urethra and vaginal opening. This rinses away any urine and blood. °? You may do this instead of wiping. As you start healing, you may use the squirt bottle before wiping yourself. Make sure to wipe gently. °? Fill the squirt bottle with clean water every time you use the bathroom. °· You will be given sanitary pads to wear. °How can I expect to feel? °· You may not feel the need to urinate for several hours after delivery. °· You will have some soreness and pain in your abdomen and vagina. °· If you are breastfeeding, you may have uterine contractions every time you breastfeed for up to several weeks postpartum. Uterine contractions help your uterus return to its normal size. °· It is normal to have vaginal bleeding (lochia) after delivery. The amount and appearance of lochia is often similar to a menstrual period in the first week after delivery. It will gradually decrease over the next few weeks to a dry, yellow-brown discharge. For most women, lochia stops completely by 6-8 weeks after delivery. Vaginal bleeding can vary from woman to woman. °· Within the first few  days after delivery, you may have breast engorgement. This is when your breasts feel heavy, full, and uncomfortable. Your breasts may also throb and feel hard, tightly stretched, warm, and tender. After this occurs, you may have milk leaking from your breasts. Your health care provider can help you relieve discomfort due to breast engorgement. Breast engorgement should go away within a few days. °· You may feel more sad or worried than normal due to hormonal changes after delivery. These feelings should not last more than a few days. If these feelings do not go away after several days, speak with your health care provider. °How should I care for myself? °· Tell your health care provider if you have pain or discomfort. °· Drink enough water to keep your urine clear or pale yellow. °· Wash your hands thoroughly with soap and water for at least 20 seconds after changing your sanitary pads, after using the toilet, and before holding or feeding your baby. °· If you are not breastfeeding, avoid touching your breasts a lot. Doing this can make your breasts produce more milk. °· If you become weak or lightheaded, or you feel like you might faint, ask for help before: °? Getting out of bed. °? Showering. °· Change your sanitary pads frequently. Watch for any changes in your flow, such as a sudden increase in volume, a change in color, the passing of large blood clots. If you pass a blood clot from your vagina, save it   to show to your health care provider. Do not flush blood clots down the toilet without having your health care provider look at them. °· Make sure that all your vaccinations are up to date. This can help protect you and your baby from getting certain diseases. You may need to have immunizations done before you leave the hospital. °· If desired, talk with your health care provider about methods of family planning or birth control (contraception). °How can I start bonding with my baby? °Spending as much time as  possible with your baby is very important. During this time, you and your baby can get to know each other and develop a bond. Having your baby stay with you in your room (rooming in) can give you time to get to know your baby. Rooming in can also help you become comfortable caring for your baby. Breastfeeding can also help you bond with your baby. °How can I plan for returning home with my baby? °· Make sure that you have a car seat installed in your vehicle. °? Your car seat should be checked by a certified car seat installer to make sure that it is installed safely. °? Make sure that your baby fits into the car seat safely. °· Ask your health care provider any questions you have about caring for yourself or your baby. Make sure that you are able to contact your health care provider with any questions after leaving the hospital. °This information is not intended to replace advice given to you by your health care provider. Make sure you discuss any questions you have with your health care provider. °Document Released: 05/29/2007 Document Revised: 01/04/2016 Document Reviewed: 07/06/2015 °Elsevier Interactive Patient Education © 2018 [employer] ° °

## 2017-10-08 NOTE — Lactation Note (Signed)
This note was copied from a baby's chart. Lactation Consultation Note  Patient Name: Carolyn Riley RJWBD'G Date: 10/08/2017   Visited with P1 Mom on day of discharge, baby 66 hrs old.  Baby term and at 6% weight loss, with adequate output.    Mom using nipple shield to help with latch.  Has a hand pump that she has been using prior to latching and placing nipple shield.    Last latch score given by Devin her MBU RN was a 8 with colostrum noted in shield after feeding.   Mom needing a lot of reassurance and support.   Mom has a DEBP Medela PIS at home.  Will add some double pumping after baby comes off the breast, 4-6 times a day, feeding baby any colostrum she expresses.  (pump kit in room, did not start pumping while in hospital.  Pump kit returned to store room)  Encouraged keeping baby STS, and feeding often when baby cues to feed.  Goal of 8-12 feedings per 24 hrs.    Mom has lanolin that she started using on her nipples for slight soreness.  Recommended coconut oil, and her RN will provide that for her.    Message sent to [facility] to set up follow-up with [facility]. Mom aware of OP lactation services and [facility] on Tuesdays.      Feeding Feeding Type: Breast Fed Length of feed: 8 min(colostrum in shield when finished)  LATCH Score Latch: Grasps breast easily, tongue down, lips flanged, rhythmical sucking.  Audible Swallowing: Spontaneous and intermittent  Type of Nipple: Flat  Comfort (Breast/Nipple): Soft / non-tender  Hold (Positioning): Assistance needed to correctly position infant at breast and maintain latch.  LATCH Score: 8     Lactation Tools Discussed/Used Tools: Nipple Shields Nipple shield size: 16 Breast pump type: Manual         Broadus John 10/08/2017, 10:17 AM

## 2017-10-08 NOTE — Discharge Summary (Addendum)
OB Discharge Summary     Patient Name: Carolyn Riley DOB: 07/01/1996 MRN: [id number]  Date of admission: 10/06/2017 Delivering MD: Pincus LargePHELPS, JAZMA Y   Date of discharge: 10/08/2017  Admitting diagnosis: LABOR Intrauterine pregnancy: 7519w5d     Secondary diagnosis:  Principal Problem:   SVD (spontaneous vaginal delivery) Active Problems:   GBS (group b Streptococcus) UTI complicating pregnancy, first trimester   Normal labor   Post term pregnancy  Additional problems: LSIL pap smear     Discharge diagnosis: Term Pregnancy Delivered                                                                                                Post partum procedures:none  Augmentation: AROM and Pitocin  Complications: None  Hospital course:  Onset of Labor With Vaginal Delivery     22 y.o. yo G1P1001 at 5519w5d was admitted in Active Labor on 10/06/2017, which was augmented with AROm and Pitocin. Patient had an uncomplicated labor course as follows:  Membrane Rupture Time/Date: 2:39 PM ,10/06/2017   Intrapartum Procedures: Episiotomy: None [1]                                         Lacerations:  Vaginal [6]  Patient had a delivery of a Viable infant. 10/06/2017  Information for the patient's newborn:  Alphia Mohspinoza, Boy Richanda [[id number]][[id number]]  Delivery Method: Vag-Spont    Pateint had an uncomplicated postpartum course.  She is ambulating, tolerating a regular diet, passing flatus, and urinating well. Patient is discharged home in stable condition on 10/08/17.   Physical exam  Vitals:   10/07/17 0230 10/07/17 1053 10/07/17 1836 10/08/17 0822  BP: (!) 102/48  120/73 (!) 110/54  Pulse: 66  (!) 57 (!) 52  Resp: 20  18 20   Temp: 99.6 F (37.6 C) 97.9 F (36.6 C) 97.7 F (36.5 C) 97.7 F (36.5 C)  TempSrc:  Oral Oral Oral  Weight:      Height:       General: alert, cooperative and no distress Lochia: appropriate Uterine Fundus: firm Incision: N/A DVT Evaluation: No evidence of DVT seen on  physical exam. No significant calf/ankle edema. Labs: Lab Results  Component Value Date   WBC 16.3 (H) 10/06/2017   HGB 13.7 10/06/2017   HCT 40.1 10/06/2017   MCV 90.1 10/06/2017   PLT 233 10/06/2017   CMP Latest Ref Rng & Units 09/26/2017  Glucose 65 - 99 mg/dL 82  BUN 6 - 20 mg/dL 14  Creatinine 7.820.44 - 9.561.00 mg/dL 2.130.77  Sodium 086135 - 578145 mmol/L 135  Potassium 3.5 - 5.1 mmol/L 4.3  Chloride 101 - 111 mmol/L 105  CO2 22 - 32 mmol/L 24  Calcium 8.9 - 10.3 mg/dL 9.3  Total Protein 6.5 - 8.1 g/dL 6.8  Total Bilirubin 0.3 - 1.2 mg/dL 0.3  Alkaline Phos 38 - 126 U/L 146(H)  AST 15 - 41 U/L 22  ALT 14 - 54 U/L 22    Discharge instruction: per  After Visit Summary and "Baby and Me Booklet".  After visit meds:  Allergies as of 10/08/2017   No Known Allergies     Medication List    TAKE these medications   ibuprofen 600 MG tablet Commonly known as:  ADVIL,MOTRIN Take 1 tablet (600 mg total) by mouth every 6 (six) hours.   prenatal multivitamin Tabs tablet Take 1 tablet by mouth daily at 12 noon.       Diet: routine diet  Activity: Advance as tolerated. Pelvic rest for 6 weeks.   Outpatient follow up:  4 weeks Follow up Appt: Future Appointments  Date Time Provider Department Center  11/07/2017 10:30 AM Aviva Signs, CNM [facility] None   Patient needs postpartum Colposcopy at postpartum visit d/t LSIL pap  Postpartum contraception: considering IUD  Newborn Data: Live born female  Birth Weight: 6 lb 12.8 oz (3085 g) APGAR: 7, 9  Newborn Delivery   Birth date/time:  10/06/2017 18:58:00 Delivery type:  Vaginal, Spontaneous     Baby Feeding: Breast Disposition:home with mother   10/08/2017 Frederik Pear, MD

## 2017-10-08 NOTE — Final Progress Note (Signed)
Pt refused am vital sign and medication, due to exhaustion.

## 2017-11-07 ENCOUNTER — Ambulatory Visit (INDEPENDENT_AMBULATORY_CARE_PROVIDER_SITE_OTHER): Payer: Medicaid Other | Admitting: Advanced Practice Midwife

## 2017-11-07 ENCOUNTER — Encounter: Payer: Self-pay | Admitting: Advanced Practice Midwife

## 2017-11-07 VITALS — BP 106/63 | HR 89 | Ht 63.0 in | Wt 155.0 lb

## 2017-11-07 DIAGNOSIS — Z30430 Encounter for insertion of intrauterine contraceptive device: Secondary | ICD-10-CM | POA: Diagnosis not present

## 2017-11-07 DIAGNOSIS — Z975 Presence of (intrauterine) contraceptive device: Secondary | ICD-10-CM | POA: Insufficient documentation

## 2017-11-07 DIAGNOSIS — Z3202 Encounter for pregnancy test, result negative: Secondary | ICD-10-CM

## 2017-11-07 DIAGNOSIS — Z1389 Encounter for screening for other disorder: Secondary | ICD-10-CM | POA: Diagnosis not present

## 2017-11-07 LAB — POCT URINE PREGNANCY: Preg Test, Ur: NEGATIVE

## 2017-11-07 NOTE — Patient Instructions (Signed)

## 2017-11-07 NOTE — Addendum Note (Signed)
Addended by: Anell BarrHOWARD, JENNIFER L on: 11/07/2017 02:40 PM   Modules accepted: Orders

## 2017-11-07 NOTE — Progress Notes (Addendum)
Post Partum Exam  Carolyn BumpersSamantha Riley is a 22 y.o. 671P1001 female who presents for a postpartum visit. She is 5 weeks postpartum following a spontaneous vaginal delivery. I have fully reviewed the prenatal and intrapartum course. The delivery was at 40 gestational weeks.  Anesthesia: epidural. Postpartum course has been uneventful. Baby's course has been uneventful. Baby is feeding by both breast and bottle.. Bleeding no bleeding. Bowel function is normal. Bladder function is normal. Patient is not sexually active. Contraception method is IUD. Postpartum depression screening:neg score 0  The following portions of the patient's history were reviewed and updated as appropriate: allergies, current medications, past family history, past medical history, past social history, past surgical history and problem list. Last pap smear done 04-13-17 and was Abnormal- LGSIL CIN-1/HPV   C/O headaches.  Tylenol doesn't help  Breastfeeding well.  Not having much of an appetite.  No nausea, just doesn't feel like eating.  No depression.  Wants to go back to gym  Desires IUD for contraception  Review of Systems Pertinent items are noted in HPI.    Objective:  unknown if currently breastfeeding.  General:  alert, cooperative and no distress   Breasts:  inspection negative, no nipple discharge or bleeding, no masses or nodularity palpable  Lungs: clear to auscultation bilaterally  Heart:  normal rate  Abdomen: soft, non-tender; bowel sounds normal; no masses,  no organomegaly   Vulva:  normal  Vagina: normal vagina, no discharge, exudate, lesion, or erythema  Cervix:  multiparous appearance  Corpus: normal size, contour, position, consistency, mobility, non-tender  Adnexa:  normal adnexa  Rectal Exam: Not performed.        Patient identified, informed consent performed, signed copy in chart, time out was performed.  Urine pregnancy test negative.  Speculum placed in the vagina.  Cervix visualized.  Cleaned  with Betadine x 2.  Uterus sounded to 5-6cm.  Lilletta IUD placed per manufacturer's recommendations.  Strings trimmed to 3 cm.   Patient given post procedure instructions and Mirena care card with expiration date.  Patient is asked to check IUD strings periodically and follow up in 4-6 weeks for IUD check.  Assessment:    Normal  postpartum exam. Pap smear not done at today's visit.  Headaches, likely due to low estrogen postpartum  Plan:   1. Contraception: IUD 2. Ibuprofen given here. May use PRN cramps 3. Follow up in: 1 month for string check or as needed.  4.  Rx Fioricet for H/A

## 2017-11-08 DIAGNOSIS — Z30430 Encounter for insertion of intrauterine contraceptive device: Secondary | ICD-10-CM | POA: Diagnosis not present

## 2017-11-08 MED ORDER — LEVONORGESTREL 19.5 MCG/DAY IU IUD
INTRAUTERINE_SYSTEM | Freq: Once | INTRAUTERINE | Status: AC
  Administered 2017-11-08: 1 via INTRAUTERINE

## 2017-11-08 MED ORDER — BUTALBITAL-APAP-CAFFEINE 50-325-40 MG PO CAPS: 1.0000 | ORAL_CAPSULE | Freq: Four times a day (QID) | ORAL | 3 refills | Status: DC | PRN

## 2017-11-08 NOTE — Addendum Note (Signed)
Addended by: Aviva SignsWILLIAMS, Sheri Gatchel L on: 11/08/2017 08:28 PM   Modules accepted: Orders

## 2017-11-08 NOTE — Addendum Note (Signed)
Addended by: Anell BarrHOWARD, Inocente Krach L on: 11/08/2017 01:17 PM   Modules accepted: Orders

## 2017-12-05 ENCOUNTER — Encounter: Payer: Self-pay | Admitting: Advanced Practice Midwife

## 2017-12-05 ENCOUNTER — Ambulatory Visit (INDEPENDENT_AMBULATORY_CARE_PROVIDER_SITE_OTHER): Payer: Medicaid Other | Admitting: Advanced Practice Midwife

## 2017-12-05 DIAGNOSIS — Z30431 Encounter for routine checking of intrauterine contraceptive device: Secondary | ICD-10-CM | POA: Diagnosis not present

## 2017-12-05 DIAGNOSIS — R87612 Low grade squamous intraepithelial lesion on cytologic smear of cervix (LGSIL): Secondary | ICD-10-CM

## 2017-12-05 NOTE — Patient Instructions (Signed)

## 2017-12-05 NOTE — Progress Notes (Signed)
Pt has no complaints with IUD- minimal bleeding

## 2017-12-05 NOTE — Progress Notes (Signed)
  GYNECOLOGY CLINIC PROGRESS NOTE  History:  22 y.o. G1P1001 here today for today for IUD string check; Liletta IUD was placed on 11/07/17. No complaints about the Liletta, no concerning side effects.  The following portions of the patient's history were reviewed and updated as appropriate: allergies, current medications, past family history, past medical history, past social history, past surgical history and problem list. Last pap smear on 04/13/18 was abnormal, with LSIL and no reflex HPV testing.  Per ASCCP guidelines pt needs Pap in 1 year.    Review of Systems:  Pertinent items are noted in HPI.   Objective:  Physical Exam Blood pressure (!) 99/57, pulse 75, weight 150 lb (68 kg), currently breastfeeding. Gen: NAD Abd: Soft, nontender and nondistended Pelvic: Normal appearing external genitalia; normal appearing vaginal mucosa and cervix.  IUD strings visualized, about 3 cm in length outside cervix.   Assessment & Plan:  Normal IUD check. Patient to keep IUD in place for five years; can come in for removal if she desires pregnancy within the next five years. Routine preventative health maintenance measures emphasized. Repeat Pap in 03/2018 year related to abnormal Pap   Sharen CounterLisa Leftwich-Kirby, CNM 10:44 AM

## 2018-07-19 ENCOUNTER — Ambulatory Visit: Payer: Medicaid Other | Admitting: Family Medicine

## 2018-07-26 ENCOUNTER — Ambulatory Visit: Payer: Self-pay | Admitting: Family Medicine

## 2018-07-26 ENCOUNTER — Encounter: Payer: Self-pay | Admitting: Family Medicine

## 2018-07-26 VITALS — BP 102/62 | HR 88 | Wt 137.0 lb

## 2018-07-26 DIAGNOSIS — R87612 Low grade squamous intraepithelial lesion on cytologic smear of cervix (LGSIL): Secondary | ICD-10-CM

## 2018-07-26 DIAGNOSIS — R102 Pelvic and perineal pain: Secondary | ICD-10-CM

## 2018-07-26 NOTE — Progress Notes (Signed)
   Subjective:    Patient ID: Carolyn BumpersSamantha Riley, female    DOB: 11/10/1995, 22 y.o.   MRN: [id number]  HPI Patient seen for repeat PAP due to LSIL. Additionally, having intermittent pelvic cramping over the past 2 weeks. Having some spotting for 5 days every 3 weeks. Had some cramping with last cycle as well. No abnormal discharge.    Review of Systems     Objective:   Physical Exam Cardiovascular:     Rate and Rhythm: Normal rate.     Pulses: Normal pulses.  Pulmonary:     Effort: Pulmonary effort is normal.  Abdominal:     Hernia: There is no hernia in the right inguinal area or left inguinal area.  Genitourinary:    Labia:        Right: No rash, tenderness or lesion.        Left: No rash, tenderness or lesion.      Vagina: No signs of injury and foreign body. No vaginal discharge, erythema, tenderness, bleeding, lesions or prolapsed vaginal walls.     Cervix: No cervical motion tenderness, discharge, friability, lesion, erythema or cervical bleeding.     Uterus: Not deviated, not enlarged, not fixed and not tender.      Lymphadenopathy:     Lower Body: No right inguinal adenopathy. No left inguinal adenopathy.  Neurological:     Mental Status: She is alert.       Assessment & Plan:  1. LGSIL on Pap smear of cervix - Cytology - PAP( [place])  2. Pelvic pain in female Cultures done off PAP. May need a week of doxycycline for inflammation. IUD stem not visualized in cervical canal.

## 2018-07-26 NOTE — Progress Notes (Signed)
Patient concerned about cramping daily that she is having with her IUD> Carolyn StammerJennifer Howard RN

## 2018-07-31 LAB — CYTOLOGY - PAP
BACTERIAL VAGINITIS: POSITIVE — AB
CHLAMYDIA, DNA PROBE: NEGATIVE
Candida vaginitis: NEGATIVE
HPV (WINDOPATH): DETECTED — AB
NEISSERIA GONORRHEA: NEGATIVE
Trichomonas: NEGATIVE

## 2018-07-31 MED ORDER — METRONIDAZOLE 500 MG PO TABS: 500.0000 mg | ORAL_TABLET | Freq: Two times a day (BID) | ORAL | 0 refills | Status: DC

## 2018-07-31 NOTE — Addendum Note (Signed)
Addended by: Levie HeritageSTINSON, JACOB J on: 07/31/2018 02:43 PM   Modules accepted: Orders

## 2018-08-03 ENCOUNTER — Telehealth: Payer: Self-pay

## 2018-08-03 NOTE — Telephone Encounter (Signed)
Patient called. Left message for her to return call to office (needsto  schedule a colposcopy.) Armandina StammerJennifer Howard RN

## 2018-08-06 NOTE — Telephone Encounter (Signed)
Patient made aware that she has abnormal pap smear and needs colposcopy. Patient confirms she does not have insurance. Made her aware we have submitted her name to our BCEP program. Will see if she can be accepted into program before scheduling colposcopy. Armandina StammerJennifer Howard RN

## 2018-08-06 NOTE — Telephone Encounter (Signed)
Sent patient name to Carolyn Riley for enrollment in BCEP program since she does not have insurance and has abnormal pap smear. Armandina StammerJennifer Jerel Sardina RN

## 2018-09-17 ENCOUNTER — Encounter: Payer: Self-pay | Admitting: Family Medicine

## 2018-09-17 ENCOUNTER — Ambulatory Visit (INDEPENDENT_AMBULATORY_CARE_PROVIDER_SITE_OTHER): Payer: No Typology Code available for payment source | Admitting: Family Medicine

## 2018-09-17 ENCOUNTER — Other Ambulatory Visit (HOSPITAL_COMMUNITY)
Admission: RE | Admit: 2018-09-17 | Discharge: 2018-09-17 | Disposition: A | Discharge status: Home / Self Care | Payer: No Typology Code available for payment source | Source: Ambulatory Visit | Attending: Family Medicine | Admitting: Family Medicine

## 2018-09-17 VITALS — BP 97/58 | HR 78 | Ht 63.0 in | Wt 132.0 lb

## 2018-09-17 DIAGNOSIS — R87611 Atypical squamous cells cannot exclude high grade squamous intraepithelial lesion on cytologic smear of cervix (ASC-H): Secondary | ICD-10-CM

## 2018-09-17 DIAGNOSIS — Z3202 Encounter for pregnancy test, result negative: Secondary | ICD-10-CM

## 2018-09-17 DIAGNOSIS — N870 Mild cervical dysplasia: Secondary | ICD-10-CM

## 2018-09-17 LAB — POCT URINE PREGNANCY: Preg Test, Ur: NEGATIVE

## 2018-09-17 MED ORDER — DOXYCYCLINE HYCLATE 100 MG PO CAPS: 100.0000 mg | ORAL_CAPSULE | Freq: Two times a day (BID) | ORAL | 0 refills | Status: AC

## 2018-09-17 NOTE — Progress Notes (Signed)
Patient Name: Carolyn Riley, female   DOB: May 13, 1996, 23 y.o.  MRN: [id number]  Colposcopy Procedure Note:  G1P1001 Pregnancy status: Unknown Indications: ASCUS, +HR HPV HPV:  Positive Cervical History:  Previous Abnormal Pap: LSIL  Previous Colposcopy: n/a  Previous LEEP or Cryo: n/a  Smoking: Never Smoked Hysterectomy: No   Patient given informed consent, signed copy in the chart, time out was performed.    Exam: Vulva and Vagina grossly normal.  Cervix viewed with speculum and colposcope after application of acetic acid:  Cervix Fully Visualized Squamocolumnar Junction Visibility: Fully visualized  Acetowhite lesions: 7 o'clock  Other Lesions: None Punctation: Not present  Mosaicism: Not present Abnormal vasculature: No   Biopsies: 7 o'clock ECC: Yes - Curette and Brush  Hemostasis achieved with:  Monsel's Solution  Colposcopy Impression:  CIN1   Patient was given post procedure instructions.  Will call patient with results.  Patient having constant cramping with IUD - will give doxycycline x 10 days.

## 2018-09-17 NOTE — Addendum Note (Signed)
Addended by: Lorelle Gibbs L on: 09/17/2018 10:51 AM   Modules accepted: Orders

## 2018-09-19 ENCOUNTER — Encounter: Payer: Self-pay | Admitting: Family Medicine

## 2018-10-19 ENCOUNTER — Ambulatory Visit: Payer: No Typology Code available for payment source | Admitting: Family Medicine

## 2018-10-25 ENCOUNTER — Ambulatory Visit: Payer: No Typology Code available for payment source | Admitting: Family Medicine

## 2018-10-25 DIAGNOSIS — Z30431 Encounter for routine checking of intrauterine contraceptive device: Secondary | ICD-10-CM

## 2019-03-07 ENCOUNTER — Ambulatory Visit (INDEPENDENT_AMBULATORY_CARE_PROVIDER_SITE_OTHER): Payer: Self-pay | Admitting: Family Medicine

## 2019-03-07 ENCOUNTER — Encounter: Payer: Self-pay | Admitting: Family Medicine

## 2019-03-07 ENCOUNTER — Other Ambulatory Visit: Payer: Self-pay

## 2019-03-07 VITALS — BP 91/53 | HR 80 | Ht 63.0 in | Wt 133.1 lb

## 2019-03-07 DIAGNOSIS — L02215 Cutaneous abscess of perineum: Secondary | ICD-10-CM

## 2019-03-07 MED ORDER — SULFAMETHOXAZOLE-TRIMETHOPRIM 800-160 MG PO TABS: 1.0000 | ORAL_TABLET | Freq: Two times a day (BID) | ORAL | 0 refills | Status: AC

## 2019-03-07 NOTE — Progress Notes (Signed)
   Subjective:    Patient ID: Carolyn Riley, female    DOB: [birth year], 23 y.o.   MRN: [id number]  HPI Patient seen for bump on perineum that started approximately 2 days ago.  Initially about quarter size and quite painful -has some stinging when wipes.  No fevers, chills, nausea, vomiting.  No pain with defecation.  Denies bleeding, discharge, drainage from area.   Review of Systems     Objective:   Physical Exam Constitutional:      Appearance: Normal appearance.  Abdominal:     General: Abdomen is flat.     Palpations: Abdomen is soft.  Genitourinary:   Neurological:     Mental Status: She is alert.        Assessment & Plan:  1. Perineal abscess Bactrim twice daily x10 days.  Sitz baths or warm compresses daily.  Patient return in 1 week for reevaluation.  If worsens, seek care at emergency department sooner.

## 2019-03-07 NOTE — Progress Notes (Signed)
Pt states that she noticed a painful bump on her perineum.

## 2019-03-14 ENCOUNTER — Ambulatory Visit: Payer: Self-pay | Admitting: Family Medicine

## 2020-12-18 ENCOUNTER — Ambulatory Visit: Payer: Self-pay | Admitting: Family Medicine

## 2021-11-17 ENCOUNTER — Telehealth: Payer: Self-pay | Admitting: *Deleted

## 2021-11-17 NOTE — Telephone Encounter (Signed)
Left patient an urgent message to call the office to see if she was able to get an appointment with [facility] since she has no insurance and self pay would be to much for her. ?

## 2021-11-22 ENCOUNTER — Telehealth: Payer: Self-pay | Admitting: *Deleted

## 2021-11-22 NOTE — Telephone Encounter (Signed)
Left patient an urgent message to call the office to see if she was able to get an appointment with [facility] since she has no insurance and self pay would be to much for her. ?

## 2021-12-15 ENCOUNTER — Telehealth: Payer: Self-pay | Admitting: *Deleted

## 2021-12-15 NOTE — Telephone Encounter (Signed)
Left patient an urgent message that appointment is being canceled due to not being able to pay for visit due to no insurance and amount of self pay price. Patient can reschedule if insurance or self pay changes. ?

## 2021-12-27 ENCOUNTER — Encounter: Payer: Self-pay | Admitting: Obstetrics & Gynecology

## 2022-07-15 DIAGNOSIS — Z419 Encounter for procedure for purposes other than remedying health state, unspecified: Secondary | ICD-10-CM | POA: Diagnosis not present

## 2022-08-15 DIAGNOSIS — Z419 Encounter for procedure for purposes other than remedying health state, unspecified: Secondary | ICD-10-CM | POA: Diagnosis not present

## 2022-09-15 DIAGNOSIS — Z419 Encounter for procedure for purposes other than remedying health state, unspecified: Secondary | ICD-10-CM | POA: Diagnosis not present

## 2022-10-14 DIAGNOSIS — Z419 Encounter for procedure for purposes other than remedying health state, unspecified: Secondary | ICD-10-CM | POA: Diagnosis not present

## 2022-10-20 ENCOUNTER — Telehealth: Payer: Self-pay

## 2022-10-20 NOTE — Telephone Encounter (Signed)
Mychart msg sent

## 2022-11-14 DIAGNOSIS — Z419 Encounter for procedure for purposes other than remedying health state, unspecified: Secondary | ICD-10-CM | POA: Diagnosis not present

## 2022-12-14 DIAGNOSIS — Z419 Encounter for procedure for purposes other than remedying health state, unspecified: Secondary | ICD-10-CM | POA: Diagnosis not present

## 2022-12-15 ENCOUNTER — Encounter: Payer: Self-pay | Admitting: Obstetrics & Gynecology

## 2022-12-15 ENCOUNTER — Ambulatory Visit: Payer: Medicaid Other | Admitting: Obstetrics & Gynecology

## 2022-12-15 ENCOUNTER — Other Ambulatory Visit (HOSPITAL_COMMUNITY)
Admission: RE | Admit: 2022-12-15 | Discharge: 2022-12-15 | Disposition: A | Discharge status: Home / Self Care | Payer: Medicaid Other | Source: Ambulatory Visit | Attending: Obstetrics & Gynecology | Admitting: Obstetrics & Gynecology

## 2022-12-15 VITALS — BP 94/63 | HR 93 | Ht 63.0 in | Wt 134.0 lb

## 2022-12-15 DIAGNOSIS — N870 Mild cervical dysplasia: Secondary | ICD-10-CM | POA: Diagnosis not present

## 2022-12-15 DIAGNOSIS — R102 Pelvic and perineal pain: Secondary | ICD-10-CM | POA: Insufficient documentation

## 2022-12-15 DIAGNOSIS — Z01419 Encounter for gynecological examination (general) (routine) without abnormal findings: Secondary | ICD-10-CM | POA: Diagnosis not present

## 2022-12-15 DIAGNOSIS — Z30432 Encounter for removal of intrauterine contraceptive device: Secondary | ICD-10-CM

## 2022-12-15 DIAGNOSIS — Z1329 Encounter for screening for other suspected endocrine disorder: Secondary | ICD-10-CM | POA: Diagnosis not present

## 2022-12-15 NOTE — Progress Notes (Signed)
GYNECOLOGY ANNUAL PREVENTATIVE CARE ENCOUNTER NOTE  History:     Carolyn Riley is a 27 y.o. G54P1001 female here for a routine annual gynecologic exam.  Current complaints: pelvic pain, especially on her left side that comes and goes.  Has moderate pain currently, worried about ovarian cyst. Has Liletta in place since 2019, wants this removed today "to give my body a break". Not currently sexually active, but desires annual STI screen in addition to annual preventative healthcare maintenance labs.  No plans for other contraception for now.   Denies abnormal vaginal bleeding, discharge, problems with intercourse or other gynecologic concerns.    Gynecologic History No LMP recorded. (Menstrual status: IUD). Contraception: IUD Last Pap: 07/26/2018. Result was abnormal with ASC-H and positive HRHPV. 09/17/2018 Colposcopy showed CIN I.   Obstetric History OB History  Gravida Para Term Preterm AB Living  1 1 1     1   SAB IAB Ectopic Multiple Live Births        0 1    # Outcome Date GA Lbr Len/2nd Weight Sex Delivery Anes PTL Lv  1 Term 10/06/17 [redacted]w[redacted]d 05:59 / 03:29 6 lb 12.8 oz (3.085 kg) M Vag-Spont EPI  LIV     Birth Comments: wnl    Past Medical History:  Diagnosis Date   LGSIL on Pap smear of cervix    Medical history non-contributory     Past Surgical History:  Procedure Laterality Date   NO PAST SURGERIES      No current outpatient medications on file prior to visit.   No current facility-administered medications on file prior to visit.    No Known Allergies  Social History:  reports that she has never smoked. She has never used smokeless tobacco. She reports that she does not drink alcohol and does not use drugs.  Family History  Problem Relation Age of Onset   Cancer Neg Hx    Hypertension Neg Hx    Diabetes Neg Hx    Asthma Neg Hx    Birth defects Neg Hx    Stroke Neg Hx     The following portions of the patient's history were reviewed and updated as  appropriate: allergies, current medications, past family history, past medical history, past social history, past surgical history and problem list.  Review of Systems Pertinent items noted in HPI and remainder of comprehensive ROS otherwise negative.  Physical Exam:  BP 94/63   Pulse 93   Ht 5\' 3"  (1.6 m)   Wt 134 lb (60.8 kg)   BMI 23.74 kg/m  CONSTITUTIONAL: Well-developed, well-nourished female in no acute distress.  HENT:  Normocephalic, atraumatic, External right and left ear normal.  EYES: Conjunctivae and EOM are normal. Pupils are equal, round, and reactive to light. No scleral icterus.  NECK: Normal range of motion, supple, no masses.  Normal thyroid.  SKIN: Skin is warm and dry. No rash noted. Not diaphoretic. No erythema. No pallor. MUSCULOSKELETAL: Normal range of motion. No tenderness.  No cyanosis, clubbing, or edema. NEUROLOGIC: Alert and oriented to person, place, and time. Normal reflexes, muscle tone coordination.  PSYCHIATRIC: Normal mood and affect. Normal behavior. Normal judgment and thought content. CARDIOVASCULAR: Normal heart rate noted, regular rhythm RESPIRATORY: Clear to auscultation bilaterally. Effort and breath sounds normal, no problems with respiration noted. BREASTS: Symmetric in size. No masses, tenderness, skin changes, nipple drainage, or lymphadenopathy bilaterally. Performed in the presence of a chaperone. ABDOMEN: Soft, no distention noted.  No tenderness, rebound or guarding.  PELVIC: Normal appearing external genitalia and urethral meatus; normal appearing vaginal mucosa and cervix. Lliletta IUD strings seen.  No abnormal vaginal discharge noted.  Pap smear obtained.  Normal uterine size, no other palpable masses. Has diffuse uterine  and bilateral adnexal tenderness , L>>R.  Performed in the presence of a chaperone.  IUD Removal  Patient identified, informed consent performed, consent signed. During her pelvic exam and after her pap smear, the  strings of the IUD were grasped and pulled using ring forceps. The IUD was removed in its entirety.  Patient tolerated the procedure well.     Assessment and Plan:    1. Pelvic pain Unsure etiology, will check ancillary infection testing on pap.  Ultrasound also ordered for further evaluation, will follow up results and manage accordingly. - Cytology - PAP( [facility]) - US PELVIC COMPLETE WITH TRANSVAGINAL; Future  2. Encounter for IUD removal IUD removed. Declines any further contraception for now.  3. CIN I (cervical intraepithelial neoplasia I) Repeat pap done today, will follow up results and manage accordingly. - Cytology - PAP( [facility])  4. Well woman exam with routine gynecological exam - Cytology - PAP( ) - CBC - Comprehensive metabolic panel - Lipid panel - Hemoglobin A1c - RPR+HBsAg+HCVAb+HIV - TSH Rfx on Abnormal to Free T4 Will follow up results of pap smear and manage accordingly. Routine preventative health maintenance measures emphasized. Please refer to After Visit Summary for other counseling recommendations.      Jaynie Collins, MD, FACOG Obstetrician & Gynecologist, [facility] for [employer], [facility]

## 2022-12-16 ENCOUNTER — Encounter: Payer: Self-pay | Admitting: Obstetrics & Gynecology

## 2022-12-16 ENCOUNTER — Ambulatory Visit (INDEPENDENT_AMBULATORY_CARE_PROVIDER_SITE_OTHER): Payer: Medicaid Other

## 2022-12-16 DIAGNOSIS — R102 Pelvic and perineal pain: Secondary | ICD-10-CM | POA: Diagnosis not present

## 2022-12-16 DIAGNOSIS — N83291 Other ovarian cyst, right side: Secondary | ICD-10-CM | POA: Diagnosis not present

## 2022-12-16 LAB — CBC
Hematocrit: 40.4 % (ref 34.0–46.6)
Hemoglobin: 13.5 g/dL (ref 11.1–15.9)
MCH: 30.5 pg (ref 26.6–33.0)
MCHC: 33.4 g/dL (ref 31.5–35.7)
MCV: 91 fL (ref 79–97)
Platelets: 225 10*3/uL (ref 150–450)
RBC: 4.42 x10E6/uL (ref 3.77–5.28)
RDW: 11.7 % (ref 11.7–15.4)
WBC: 10 10*3/uL (ref 3.4–10.8)

## 2022-12-16 LAB — COMPREHENSIVE METABOLIC PANEL
ALT: 105 IU/L — ABNORMAL HIGH (ref 0–32)
AST: 146 IU/L — ABNORMAL HIGH (ref 0–40)
Albumin/Globulin Ratio: 1.5 (ref 1.2–2.2)
Albumin: 4.4 g/dL (ref 4.0–5.0)
Alkaline Phosphatase: 80 IU/L (ref 44–121)
BUN/Creatinine Ratio: 15 (ref 9–23)
BUN: 14 mg/dL (ref 6–20)
Bilirubin Total: 0.6 mg/dL (ref 0.0–1.2)
CO2: 21 mmol/L (ref 20–29)
Calcium: 9.6 mg/dL (ref 8.7–10.2)
Chloride: 101 mmol/L (ref 96–106)
Creatinine, Ser: 0.91 mg/dL (ref 0.57–1.00)
Globulin, Total: 2.9 g/dL (ref 1.5–4.5)
Glucose: 76 mg/dL (ref 70–99)
Potassium: 4.3 mmol/L (ref 3.5–5.2)
Sodium: 140 mmol/L (ref 134–144)
Total Protein: 7.3 g/dL (ref 6.0–8.5)
eGFR: 89 mL/min/{1.73_m2} (ref >59)

## 2022-12-16 LAB — LIPID PANEL
Chol/HDL Ratio: 3 ratio (ref 0.0–4.4)
Cholesterol, Total: 195 mg/dL (ref 100–199)
HDL: 65 mg/dL (ref >39)
LDL Chol Calc (NIH): 122 mg/dL — ABNORMAL HIGH (ref 0–99)
Triglycerides: 40 mg/dL (ref 0–149)
VLDL Cholesterol Cal: 8 mg/dL (ref 5–40)

## 2022-12-16 LAB — RPR+HBSAG+HCVAB+...
HIV Screen 4th Generation wRfx: NONREACTIVE
Hep C Virus Ab: NONREACTIVE
Hepatitis B Surface Ag: NEGATIVE
RPR Ser Ql: NONREACTIVE

## 2022-12-16 LAB — HEMOGLOBIN A1C
Est. average glucose Bld gHb Est-mCnc: 103 mg/dL
Hgb A1c MFr Bld: 5.2 % (ref 4.8–5.6)

## 2022-12-16 LAB — TSH RFX ON ABNORMAL TO FREE T4: TSH: 1.45 u[IU]/mL (ref 0.450–4.500)

## 2022-12-19 LAB — CYTOLOGY - PAP
Chlamydia: NEGATIVE
Comment: NEGATIVE
Comment: NEGATIVE
Comment: NORMAL
Diagnosis: NEGATIVE
Neisseria Gonorrhea: NEGATIVE
Trichomonas: NEGATIVE

## 2022-12-20 ENCOUNTER — Ambulatory Visit (INDEPENDENT_AMBULATORY_CARE_PROVIDER_SITE_OTHER): Payer: Medicaid Other | Admitting: Family Medicine

## 2022-12-20 ENCOUNTER — Encounter: Payer: Self-pay | Admitting: Family Medicine

## 2022-12-20 VITALS — BP 106/70 | HR 104 | Temp 98.0°F | Resp 18 | Ht 63.0 in | Wt 134.5 lb

## 2022-12-20 DIAGNOSIS — R1013 Epigastric pain: Secondary | ICD-10-CM

## 2022-12-20 DIAGNOSIS — R748 Abnormal levels of other serum enzymes: Secondary | ICD-10-CM | POA: Diagnosis not present

## 2022-12-20 DIAGNOSIS — Z0000 Encounter for general adult medical examination without abnormal findings: Secondary | ICD-10-CM | POA: Insufficient documentation

## 2022-12-20 DIAGNOSIS — R1011 Right upper quadrant pain: Secondary | ICD-10-CM

## 2022-12-20 DIAGNOSIS — Z7689 Persons encountering health services in other specified circumstances: Secondary | ICD-10-CM | POA: Diagnosis not present

## 2022-12-20 DIAGNOSIS — Z1329 Encounter for screening for other suspected endocrine disorder: Secondary | ICD-10-CM | POA: Insufficient documentation

## 2022-12-20 NOTE — Progress Notes (Signed)
New Patient Office Visit  Subjective    Patient ID: Carolyn Riley, female    DOB: 10-13-1995  Age: 27 y.o. MRN: [id number]  CC:  Chief Complaint  Patient presents with   Establish Care   Results    Patient is here to establish care with PCP, She is also here to discuss abnormal labs from OB gyn ( Elevated Liver Enzymes)    HPI Carolyn Riley presents to establish care with this practice. She is new to me. Recently add annual wellness with GYN with labs. She has elevated AST and ALT.  Reports fatigue, weakness, abdominal cramping in epigastric area, bloating, constipation, pain after eating. Low appetite, dry mouth, and dizziness upon standing and randomly. Symptoms present for 2-3 months. In July 2023 she had liposuction and has lost 20 pounds. Eating makes her bloated, no matter what she is eating or how small.   The most worrisome symptoms are the abdominal pain, bloating, low appetite, and constipation and diarrhea. Will continue to monitor other symptoms.   Does not drink alcohol. Gym workouts: lifts weights and walks on treadmill, 2-3 times per week.  Not on medications. Uses Tylenol rarely, maybe once per week, 500 mg.  Recent trip to [place] to visit mother, symptoms present before trip. Alternates between constipation and diarrhea. Nausea no vomiting.  Heartburn, uses alka seltzer heart burn relief, which are helpful. Symptoms present with eating.   Chart review:  ALT: 105 AST: 146 Alk phos: 80 GFR: 89 Hep B and Hep C: non reactive.  History reviewed. Medications reviewed.  Pap smear up to date.    No outpatient encounter medications on file as of 12/20/2022.   No facility-administered encounter medications on file as of 12/20/2022.    Past Medical History:  Diagnosis Date   LGSIL on Pap smear of cervix     Past Surgical History:  Procedure Laterality Date   NO PAST SURGERIES      Family History  Problem Relation Age of Onset   Healthy Mother    Atrial  fibrillation Father    Cancer Neg Hx    Hypertension Neg Hx    Diabetes Neg Hx    Asthma Neg Hx    Birth defects Neg Hx    Stroke Neg Hx     Social History   Socioeconomic History   Marital status: Single    Spouse name: Not on file   Number of children: Not on file   Years of education: Not on file   Highest education level: Not on file  Occupational History   Occupation: student  Tobacco Use   Smoking status: Never   Smokeless tobacco: Never  Vaping Use   Vaping Use: Never used  Substance and Sexual Activity   Alcohol use: No   Drug use: No   Sexual activity: Not Currently    Birth control/protection: None  Other Topics Concern   Not on file  Social History Narrative   Not on file   Social Determinants of Health   Financial Resource Strain: Not on file  Food Insecurity: Not on file  Transportation Needs: Not on file  Physical Activity: Not on file  Stress: Not on file  Social Connections: Not on file  Intimate Partner Violence: Not on file    Review of Systems  Constitutional:  Positive for malaise/fatigue and weight loss (had liposuction done, lost 20 pounds). Negative for chills and fever.  Eyes:  Positive for blurred vision (random). Negative for double vision.  Respiratory:  Negative for shortness of breath.   Cardiovascular:  Negative for leg swelling. Chest pain: congested pressure that comes and goes. Gastrointestinal:  Positive for abdominal pain, constipation, diarrhea and nausea. Negative for blood in stool and vomiting.  Genitourinary:  Negative for dysuria.  Neurological:  Positive for dizziness (upon  rising in morning, and throughout the day) and tingling (hands and feet). Negative for sensory change, speech change and weakness.  Psychiatric/Behavioral:  Negative for depression and suicidal ideas. The patient is nervous/anxious. The patient does not have insomnia.         Objective    BP 106/70   Pulse (!) 104   Temp 98 F (36.7 C)  (Oral)   Resp 18   Ht 5\' 3"  (1.6 m)   Wt 134 lb 8 oz (61 kg)   LMP  (LMP Unknown) Comment: Patient had an IUD  SpO2 98%   BMI 23.83 kg/m   Physical Exam Vitals and nursing note reviewed.  Constitutional:      General: She is not in acute distress.    Appearance: Normal appearance. She is normal weight.  Cardiovascular:     Rate and Rhythm: Regular rhythm.     Heart sounds: Normal heart sounds.  Pulmonary:     Effort: Pulmonary effort is normal.     Breath sounds: Normal breath sounds.  Abdominal:     General: Bowel sounds are normal.     Palpations: Abdomen is soft.     Tenderness: There is abdominal tenderness in the right upper quadrant and epigastric area. There is no guarding.     Hernia: No hernia is present.  Skin:    General: Skin is warm and dry.     Capillary Refill: Capillary refill takes less than 2 seconds.  Neurological:     General: No focal deficit present.     Mental Status: She is alert. Mental status is at baseline.  Psychiatric:        Mood and Affect: Mood normal.        Behavior: Behavior normal.        Thought Content: Thought content normal.        Judgment: Judgment normal.         Assessment & Plan:   Problem List Items Addressed This Visit     Right upper quadrant abdominal pain   Relevant Orders   Hepatic function panel   Lipase   US Abdomen Complete   Establishing care with new doctor, encounter for - Primary   Epigastric pain   Relevant Orders   Hepatic function panel   Lipase   US Abdomen Complete   Elevated liver enzymes   Relevant Orders   US Abdomen Complete  Hepatic function, lipase, and abdominal ultrasound ordered for further evaluation. Ultrasound scheduled for 1:00 12/21/22. Referral specialist to call patient with details.  Next steps to be determined based on results. May need GI specialist referral in near future. Follow-up in 4 weeks, sooner if anything changes. Will notify once results are back.  Agrees with plan of  care discussed.  Questions answered.   Return in about 4 weeks (around 01/17/2023) for abdominal pain .   Novella Olive, FNP

## 2022-12-21 ENCOUNTER — Other Ambulatory Visit: Payer: Medicaid Other

## 2022-12-21 LAB — HEPATIC FUNCTION PANEL
ALT: 52 IU/L — ABNORMAL HIGH (ref 0–32)
AST: 27 IU/L (ref 0–40)
Albumin: 4.5 g/dL (ref 4.0–5.0)
Alkaline Phosphatase: 81 IU/L (ref 44–121)
Bilirubin Total: 0.5 mg/dL (ref 0.0–1.2)
Bilirubin, Direct: 0.13 mg/dL (ref 0.00–0.40)
Total Protein: 7.5 g/dL (ref 6.0–8.5)

## 2022-12-21 LAB — LIPASE: Lipase: 15 U/L (ref 14–72)

## 2022-12-22 ENCOUNTER — Ambulatory Visit (INDEPENDENT_AMBULATORY_CARE_PROVIDER_SITE_OTHER): Payer: Medicaid Other

## 2022-12-22 DIAGNOSIS — R1013 Epigastric pain: Secondary | ICD-10-CM

## 2022-12-22 DIAGNOSIS — R1011 Right upper quadrant pain: Secondary | ICD-10-CM | POA: Diagnosis not present

## 2022-12-22 DIAGNOSIS — R748 Abnormal levels of other serum enzymes: Secondary | ICD-10-CM

## 2022-12-26 ENCOUNTER — Encounter: Payer: Self-pay | Admitting: Family Medicine

## 2022-12-26 DIAGNOSIS — R5383 Other fatigue: Secondary | ICD-10-CM | POA: Diagnosis not present

## 2022-12-26 DIAGNOSIS — M792 Neuralgia and neuritis, unspecified: Secondary | ICD-10-CM | POA: Diagnosis not present

## 2022-12-26 DIAGNOSIS — Z20822 Contact with and (suspected) exposure to covid-19: Secondary | ICD-10-CM | POA: Diagnosis not present

## 2022-12-26 DIAGNOSIS — R630 Anorexia: Secondary | ICD-10-CM | POA: Diagnosis not present

## 2022-12-27 ENCOUNTER — Other Ambulatory Visit: Payer: Self-pay | Admitting: Family Medicine

## 2022-12-27 DIAGNOSIS — R109 Unspecified abdominal pain: Secondary | ICD-10-CM | POA: Insufficient documentation

## 2022-12-27 NOTE — Progress Notes (Signed)
GI referral sent

## 2023-01-14 DIAGNOSIS — Z419 Encounter for procedure for purposes other than remedying health state, unspecified: Secondary | ICD-10-CM | POA: Diagnosis not present

## 2023-01-16 ENCOUNTER — Ambulatory Visit: Payer: Medicaid Other | Admitting: Family Medicine

## 2023-02-13 DIAGNOSIS — Z419 Encounter for procedure for purposes other than remedying health state, unspecified: Secondary | ICD-10-CM | POA: Diagnosis not present

## 2023-03-16 DIAGNOSIS — Z419 Encounter for procedure for purposes other than remedying health state, unspecified: Secondary | ICD-10-CM | POA: Diagnosis not present

## 2023-03-30 ENCOUNTER — Ambulatory Visit (INDEPENDENT_AMBULATORY_CARE_PROVIDER_SITE_OTHER): Payer: Medicaid Other | Admitting: Nurse Practitioner

## 2023-03-30 ENCOUNTER — Other Ambulatory Visit (INDEPENDENT_AMBULATORY_CARE_PROVIDER_SITE_OTHER): Payer: Medicaid Other

## 2023-03-30 ENCOUNTER — Encounter: Payer: Self-pay | Admitting: Nurse Practitioner

## 2023-03-30 VITALS — BP 92/60 | HR 92 | Ht 63.0 in | Wt 139.1 lb

## 2023-03-30 DIAGNOSIS — R1012 Left upper quadrant pain: Secondary | ICD-10-CM

## 2023-03-30 DIAGNOSIS — K219 Gastro-esophageal reflux disease without esophagitis: Secondary | ICD-10-CM

## 2023-03-30 LAB — CBC WITH DIFFERENTIAL/PLATELET
Basophils Absolute: 0.1 10*3/uL (ref 0.0–0.1)
Basophils Relative: 1.1 % (ref 0.0–3.0)
Eosinophils Absolute: 0.1 10*3/uL (ref 0.0–0.7)
Eosinophils Relative: 0.9 % (ref 0.0–5.0)
HCT: 42.5 % (ref 36.0–46.0)
Hemoglobin: 13.9 g/dL (ref 12.0–15.0)
Lymphocytes Relative: 34 % (ref 12.0–46.0)
Lymphs Abs: 2.9 10*3/uL (ref 0.7–4.0)
MCHC: 32.6 g/dL (ref 30.0–36.0)
MCV: 92.5 fl (ref 78.0–100.0)
Monocytes Absolute: 0.6 10*3/uL (ref 0.1–1.0)
Monocytes Relative: 6.8 % (ref 3.0–12.0)
Neutro Abs: 4.9 10*3/uL (ref 1.4–7.7)
Neutrophils Relative %: 57.2 % (ref 43.0–77.0)
Platelets: 257 10*3/uL (ref 150.0–400.0)
RBC: 4.6 Mil/uL (ref 3.87–5.11)
RDW: 13 % (ref 11.5–15.5)
WBC: 8.5 10*3/uL (ref 4.0–10.5)

## 2023-03-30 LAB — HEPATIC FUNCTION PANEL
ALT: 24 U/L (ref 0–35)
AST: 15 U/L (ref 0–37)
Albumin: 4.2 g/dL (ref 3.5–5.2)
Alkaline Phosphatase: 51 U/L (ref 39–117)
Bilirubin, Direct: 0.1 mg/dL (ref 0.0–0.3)
Total Bilirubin: 0.5 mg/dL (ref 0.2–1.2)
Total Protein: 7.2 g/dL (ref 6.0–8.3)

## 2023-03-30 LAB — LIPASE: Lipase: 12 U/L (ref 11.0–59.0)

## 2023-03-30 LAB — BASIC METABOLIC PANEL
BUN: 11 mg/dL (ref 6–23)
CO2: 30 mEq/L (ref 19–32)
Calcium: 9.5 mg/dL (ref 8.4–10.5)
Chloride: 102 mEq/L (ref 96–112)
Creatinine, Ser: 0.96 mg/dL (ref 0.40–1.20)
GFR: 81.37 mL/min (ref >60.00)
Glucose, Bld: 74 mg/dL (ref 70–99)
Potassium: 3.9 mEq/L (ref 3.5–5.1)
Sodium: 139 mEq/L (ref 135–145)

## 2023-03-30 MED ORDER — DICYCLOMINE HCL 10 MG PO CAPS: 10.0000 mg | ORAL_CAPSULE | Freq: Three times a day (TID) | ORAL | 1 refills | Status: AC | PRN

## 2023-03-30 MED ORDER — PANTOPRAZOLE SODIUM 40 MG PO TBEC: 40.0000 mg | DELAYED_RELEASE_TABLET | Freq: Every day | ORAL | 1 refills | Status: AC

## 2023-03-30 NOTE — Patient Instructions (Addendum)
Your provider has requested that you go to the basement level for lab work before leaving today. Press "B" on the elevator. The lab is located at the first door on the left as you exit the elevator.  Your provider has ordered "Diatherix" stool testing for you. You have received a kit from our office today containing all necessary supplies to complete this test. Please carefully read the stool collection instructions provided in the kit before opening the accompanying materials. In addition, be sure to place the label from the top left corner of the laboratory request sheet onto the "puritan opti-swab" tube that is supplied in the kit. This label should include your full name and date of birth. After completing the test, you should secure the purtian tube into the specimen biohazard bag. The laboratory request information sheet (including date and time of specimen collection) should be placed into the outside pocket of the specimen biohazard bag and returned to the [facility] lab with 2 days of collection.   Due to recent changes in healthcare laws, you may see the results of your imaging and laboratory studies on MyChart before your provider has had a chance to review them.  We understand that in some cases there may be results that are confusing or concerning to you. Not all laboratory results come back in the same time frame and the provider may be waiting for multiple results in order to interpret others.  Please give [place] 48 hours in order for your provider to thoroughly review all the results before contacting the office for clarification of your results.   Thank you for trusting me with your gastrointestinal care!   Alcide Evener, CRNP

## 2023-03-30 NOTE — Progress Notes (Signed)
03/30/2023 Carolyn Riley [id number] 24-Dec-1995   CHIEF COMPLAINT: Abdominal pain  HISTORY OF PRESENT ILLNESS: Carolyn Riley is a 27 year old female with no significant past medical history. She presents to our office today as referred by Dorena Bodo NP for further evaluation regarding LUQ pain. She endorses having pressure-like left upper quadrant abdominal pain since January 2024 which is progressively worsened. She describes her LUQ pain as a pressure discomfort and sometimes feels like she has been kicked to that area. She has heartburn 3 days weekly for which she takes Tums as needed. She stated she has had on and off heartburn for many years. No dysphagia. Drinking espresso and eating spicy or citrus foods trigger her reflux symptoms. She has some nausea without vomiting. No NSAID use. She is passing normal brown formed stools daily.  She does not feel constipated.  No rectal bleeding or black stools.  She is also concerned regarding elevated LFTs which were noted after she had laboratory studies done 12/2022 prior to having her IUD removed. Labs 12/15/2022 showed a AST level of 146.  ALT 105.  Repeat laboratory studies 12/20/2022 showed a AST level 27 and ALT 52.  Hepatitis B surface antigen negative, hepatitis C antibody negative and HIV nonreactive.  A right upper quadrant ultrasound 12/22/2022 showed a normal gallbladder and liver.  She denied having any associated viral illness symptoms. She rarely drinks any alcohol.  No drug use. No known family history of liver disease. No fever, sweats or chills. No weight loss.     Latest Ref Rng & Units 12/15/2022    3:11 PM 10/06/2017    9:38 AM 09/26/2017    5:02 PM  CBC  WBC 3.4 - 10.8 x10E3/uL 10.0  16.3  11.7   Hemoglobin 11.1 - 15.9 g/dL 25.9  56.3  87.5   Hematocrit 34.0 - 46.6 % 40.4  40.1  37.5   Platelets 150 - 450 x10E3/uL 225  233  215        Latest Ref Rng & Units 12/20/2022    2:55 PM 12/15/2022    3:11 PM 09/26/2017    5:02 PM   CMP  Glucose 70 - 99 mg/dL  76  82   BUN 6 - 20 mg/dL  14  14   Creatinine 6.43 - 1.00 mg/dL  3.29  5.18   Sodium 841 - 144 mmol/L  140  135   Potassium 3.5 - 5.2 mmol/L  4.3  4.3   Chloride 96 - 106 mmol/L  101  105   CO2 20 - 29 mmol/L  21  24   Calcium 8.7 - 10.2 mg/dL  9.6  9.3   Total Protein 6.0 - 8.5 g/dL 7.5  7.3  6.8   Total Bilirubin 0.0 - 1.2 mg/dL 0.5  0.6  0.3   Alkaline Phos 44 - 121 IU/L 81  80  146   AST 0 - 40 IU/L 27  146  22   ALT 0 - 32 IU/L 52  105  22     Labs 12/15/2022: Hepatitis B surface antigen negative.  Hepatitis C antibody reactive.  HIV nonreactive.   RUQ sonogram 12/22/2022:  FINDINGS: Gallbladder: No gallstones or wall thickening visualized. No sonographic Murphy sign noted by sonographer.   Common bile duct: Diameter: 4 mm   Liver: No focal lesion identified. Within normal limits in parenchymal echogenicity. Portal vein is patent on color Doppler imaging with normal direction of blood flow towards the liver.  IVC: No abnormality visualized.   Pancreas: Visualized portion unremarkable.   Spleen: Size and appearance within normal limits.   Right Kidney: Length: 10.9 cm. Echogenicity within normal limits. No mass or hydronephrosis visualized.   Left Kidney: Length: 9.5 cm. Echogenicity within normal limits. No mass or hydronephrosis visualized.   Abdominal aorta: No aneurysm visualized.   Other findings: None.   IMPRESSION: Negative abdominal ultrasound.   Past Medical History:  Diagnosis Date   LGSIL on Pap smear of cervix    Past Surgical History: Smart Liposuction surgery 02/2022.  Social History: She is single.  She has 1 son.  She is a Consulting civil engineer, studying sonography.  Infrequent alcohol use.  No drug use.  She drinks 3 cups of espresso daily.   Family History: No known family history of esophageal, gastric or colon cancer. MGF died pancreatic cancer. Father with hypertension and atrial fibrillation.  Mother is healthy.   No  Known Allergies   Outpatient Encounter Medications as of 03/30/2023  Medication Sig   dicyclomine (BENTYL) 10 MG capsule Take 1 capsule (10 mg total) by mouth 3 (three) times daily as needed for spasms.   pantoprazole (PROTONIX) 40 MG tablet Take 1 tablet (40 mg total) by mouth daily. Take 30 minutes before breakfast.   Probiotic Product (PROBIOTIC PO) Take 1 capsule by mouth daily.   No facility-administered encounter medications on file as of 03/30/2023.   REVIEW OF SYSTEMS:  Gen: Denies fever, sweats or chills. No weight loss.  CV: Denies chest pain, palpitations or edema. Resp: Denies cough, shortness of breath of hemoptysis.  GI: See HPI. GU: Denies urinary burning, blood in urine, increased urinary frequency or incontinence. MS: Denies joint pain, muscles aches or weakness. Derm: Denies rash, itchiness, skin lesions or unhealing ulcers. Psych: Denies depression, anxiety, memory loss or confusion. Heme: Denies bruising, easy bleeding. Neuro:  Denies headaches, dizziness or paresthesias. Endo:  Denies any problems with DM, thyroid or adrenal function.  PHYSICAL EXAM: BP 92/60 (BP Location: Left Arm, Patient Position: Sitting, Cuff Size: Normal)   Pulse 92   Ht 5\' 3"  (1.6 m) Comment: height measured without shoes  Wt 139 lb 2 oz (63.1 kg)   LMP 03/23/2023   BMI 24.64 kg/m   General: 27 year old female in no acute distress. Head: Normocephalic and atraumatic. Eyes:  Sclerae non-icteric, conjunctive pink. Ears: Normal auditory acuity. Mouth: Dentition intact. No ulcers or lesions.  Neck: Supple, no lymphadenopathy.  Thyroid lobe slightly full, R > L without discrete nodule. Lungs: Clear bilaterally to auscultation without wheezes, crackles or rhonchi. Heart: Regular rate and rhythm. No murmur, rub or gallop appreciated.  Abdomen: Soft, nondistended.  LUQ tenderness without rebound or guarding.  No flank tenderness.  No masses. No hepatosplenomegaly. Normoactive bowel sounds x 4  quadrants.  Rectal: Deferred.  Musculoskeletal: Symmetrical with no gross deformities. Skin: Warm and dry. No rash or lesions on visible extremities. Extremities: No edema. Neurological: Alert oriented x 4, no focal deficits.  Psychological:  Alert and cooperative. Normal mood and affect.  ASSESSMENT AND PLAN:  27 year old female with LUQ pain x 7 months -Check Diatherix H. Pylori stool antigen  -CBC, hepatic panel, BMP and lipase level -GERD handout -Reduce espresso to 1 cup daily -Pantoprazole 40 mg 1 capsule p.o. daily to be taken 30 minutes before breakfast -Pepcid 20 mg 1 tab p.o. nightly as needed -Dicyclomine 10 mg 1 p.o. 3 times daily as needed abdominal pain -I discussed scheduling an EGD if her symptoms  persist or worsen. EGD benefits and risks discussed including risk with sedation, risk of bleeding, perforation and infection  -Schedule CTAP if LFTs or lipase level elevated  GERD symptoms  -See plan above   Elevated LFTs.  RUQ sonogram 12/2022 showed a normal gallbladder and liver without biliary ductal dilatation.  Negative hepatitis B surface antigen, hepatitis C antibody negative and HIV nonreactive. -Hepatic panel  -Comprehensive hepatic serologies will be ordered if LFTs remain elevated  Right thyroid lobe slightly > left -Follow up with PCP    CC:  Novella Olive, FNP

## 2023-04-05 NOTE — Progress Notes (Signed)
____________________________________________________________  Attending physician addendum:  Thank you for sending this case to me. I have reviewed the entire note and agree with the plan.   Henry Danis, MD  ____________________________________________________________  

## 2023-04-16 DIAGNOSIS — Z419 Encounter for procedure for purposes other than remedying health state, unspecified: Secondary | ICD-10-CM | POA: Diagnosis not present

## 2023-05-16 DIAGNOSIS — Z419 Encounter for procedure for purposes other than remedying health state, unspecified: Secondary | ICD-10-CM | POA: Diagnosis not present

## 2023-06-16 DIAGNOSIS — Z419 Encounter for procedure for purposes other than remedying health state, unspecified: Secondary | ICD-10-CM | POA: Diagnosis not present

## 2023-07-16 DIAGNOSIS — Z419 Encounter for procedure for purposes other than remedying health state, unspecified: Secondary | ICD-10-CM | POA: Diagnosis not present

## 2023-08-10 ENCOUNTER — Ambulatory Visit: Payer: Medicaid Other | Admitting: Family Medicine

## 2023-08-16 DIAGNOSIS — Z419 Encounter for procedure for purposes other than remedying health state, unspecified: Secondary | ICD-10-CM | POA: Diagnosis not present

## 2023-08-17 ENCOUNTER — Ambulatory Visit (INDEPENDENT_AMBULATORY_CARE_PROVIDER_SITE_OTHER): Payer: Medicaid Other | Admitting: Family Medicine

## 2023-08-17 ENCOUNTER — Encounter: Payer: Self-pay | Admitting: Family Medicine

## 2023-08-17 ENCOUNTER — Ambulatory Visit (HOSPITAL_COMMUNITY)
Admission: RE | Admit: 2023-08-17 | Discharge: 2023-08-17 | Disposition: A | Discharge status: Home / Self Care | Payer: Medicaid Other | Source: Ambulatory Visit | Attending: Family Medicine | Admitting: Family Medicine

## 2023-08-17 VITALS — BP 95/68 | HR 61 | Temp 98.0°F | Resp 16 | Ht 63.0 in | Wt 142.0 lb

## 2023-08-17 DIAGNOSIS — Z01818 Encounter for other preprocedural examination: Secondary | ICD-10-CM | POA: Diagnosis not present

## 2023-08-17 DIAGNOSIS — Z01810 Encounter for preprocedural cardiovascular examination: Secondary | ICD-10-CM | POA: Insufficient documentation

## 2023-08-17 DIAGNOSIS — Z0000 Encounter for general adult medical examination without abnormal findings: Secondary | ICD-10-CM

## 2023-08-17 NOTE — Assessment & Plan Note (Addendum)
 Complete physical exam completed.

## 2023-08-17 NOTE — Assessment & Plan Note (Signed)
 Labs, chest x-ray, EKG ordered today. Discussed need to contact surgeon to inquire about bilateral breast ultrasound, without diagnosis code to indicate a problem this is unable to be completed before surgery. Called surgeon to inform don't worry about it per patient report.  Paperwork will be completed once results are available.

## 2023-08-17 NOTE — Progress Notes (Signed)
 Established Patient Office Visit  Subjective   Patient ID: Carolyn Riley, female    DOB: Jan 21, 1996  Age: 28 y.o. MRN: [id number]  Chief Complaint  Patient presents with   Medical Management of Chronic Issues    Cosmetic surgery Medical clearance paperwork for surgery on 09/01/23    HPI Presents for pre-op labs/test and with physical exam.  She is going to [place]  for cosmetic surgery.  She has paperwork to be completed once lab results are back.  Multiple labs, chest x-ray, EKG needed for upcoming surgery.  EKG shows NSR in office today.  Understands the bilateral ultrasound cannot be performed without diagnosis code to indicate a problem with the breast.  She will contact surgeon.      ROS    Objective:     BP 95/68   Pulse 61   Temp 98 F (36.7 C) (Oral)   Resp 16   Ht 5' 3 (1.6 m)   Wt 142 lb (64.4 kg)   SpO2 100%   BMI 25.15 kg/m  BP Readings from Last 3 Encounters:  08/17/23 95/68  03/30/23 92/60  12/20/22 106/70      Physical Exam Vitals and nursing note reviewed.  Constitutional:      General: She is not in acute distress.    Appearance: Normal appearance.  HENT:     Right Ear: Tympanic membrane normal.     Left Ear: Tympanic membrane normal.     Nose: Nose normal.     Mouth/Throat:     Mouth: Mucous membranes are moist.     Pharynx: Oropharynx is clear.  Eyes:     Extraocular Movements: Extraocular movements intact.  Neck:     Thyroid : No thyroid  tenderness.  Cardiovascular:     Rate and Rhythm: Normal rate and regular rhythm.     Pulses:          Radial pulses are 2+ on the right side and 2+ on the left side.     Heart sounds: Normal heart sounds, S1 normal and S2 normal.  Pulmonary:     Effort: Pulmonary effort is normal.     Breath sounds: Normal breath sounds.  Abdominal:     General: Bowel sounds are normal.     Palpations: Abdomen is soft.     Tenderness: There is no abdominal tenderness.  Musculoskeletal:        General:  Normal range of motion.     Cervical back: Normal range of motion.     Right lower leg: No edema.     Left lower leg: No edema.  Lymphadenopathy:     Cervical:     Right cervical: No superficial cervical adenopathy.    Left cervical: No superficial cervical adenopathy.  Skin:    General: Skin is warm and dry.  Neurological:     General: No focal deficit present.     Mental Status: She is alert. Mental status is at baseline.  Psychiatric:        Mood and Affect: Mood normal.        Behavior: Behavior normal.        Thought Content: Thought content normal.        Judgment: Judgment normal.     No results found for any visits on 08/17/23.    The ASCVD Risk score (Arnett DK, et al., 2019) failed to calculate for the following reasons:   The 2019 ASCVD risk score is only valid for ages 54 to 57  Assessment & Plan:   Problem List Items Addressed This Visit     Annual physical exam - Primary   Complete physical exam completed.        Relevant Orders   CBC with Differential/Platelet   Comprehensive metabolic panel   TSH   Hemoglobin A1c   Pre-op examination   Labs, chest x-ray, EKG ordered today. Discussed need to contact surgeon to inquire about bilateral breast ultrasound, without diagnosis code to indicate a problem this is unable to be completed before surgery. Called surgeon to inform don't worry about it per patient report.  Paperwork will be completed once results are available.       Relevant Orders   CBC with Differential/Platelet   Comprehensive metabolic panel   HIV Antibody (routine testing w rflx)   hCG, serum, qualitative   Urinalysis, Routine w reflex microscopic   TSH   Hemoglobin A1c   Hepatitis C antibody   INR/PT   APTT   EKG 12-Lead   DG Chest 2 View  Agrees with plan of care discussed.  Questions answered.   Return if symptoms worsen or fail to improve.    Darice JONELLE Brownie, FNP

## 2023-08-18 LAB — HEPATITIS C ANTIBODY: Hep C Virus Ab: NONREACTIVE

## 2023-08-18 LAB — URINALYSIS, ROUTINE W REFLEX MICROSCOPIC
Bilirubin, UA: NEGATIVE
Glucose, UA: NEGATIVE
Leukocytes,UA: NEGATIVE
Nitrite, UA: NEGATIVE
RBC, UA: NEGATIVE
Specific Gravity, UA: 1.024 (ref 1.005–1.030)
Urobilinogen, Ur: 1 mg/dL (ref 0.2–1.0)
pH, UA: 5.5 (ref 5.0–7.5)

## 2023-08-18 LAB — COMPREHENSIVE METABOLIC PANEL
ALT: 41 [IU]/L — ABNORMAL HIGH (ref 0–32)
AST: 26 [IU]/L (ref 0–40)
Albumin: 4.4 g/dL (ref 4.0–5.0)
Alkaline Phosphatase: 75 [IU]/L (ref 44–121)
BUN/Creatinine Ratio: 11 (ref 9–23)
BUN: 10 mg/dL (ref 6–20)
Bilirubin Total: 0.7 mg/dL (ref 0.0–1.2)
CO2: 27 mmol/L (ref 20–29)
Calcium: 9.8 mg/dL (ref 8.7–10.2)
Chloride: 106 mmol/L (ref 96–106)
Creatinine, Ser: 0.95 mg/dL (ref 0.57–1.00)
Globulin, Total: 2.8 g/dL (ref 1.5–4.5)
Glucose: 74 mg/dL (ref 70–99)
Potassium: 4 mmol/L (ref 3.5–5.2)
Sodium: 141 mmol/L (ref 134–144)
Total Protein: 7.2 g/dL (ref 6.0–8.5)
eGFR: 84 mL/min/{1.73_m2} (ref >59)

## 2023-08-18 LAB — CBC WITH DIFFERENTIAL/PLATELET
Basophils Absolute: 0.1 10*3/uL (ref 0.0–0.2)
Basos: 1 %
EOS (ABSOLUTE): 0.1 10*3/uL (ref 0.0–0.4)
Eos: 1 %
Hematocrit: 42.7 % (ref 34.0–46.6)
Hemoglobin: 14 g/dL (ref 11.1–15.9)
Immature Grans (Abs): 0 10*3/uL (ref 0.0–0.1)
Immature Granulocytes: 0 %
Lymphocytes Absolute: 2.4 10*3/uL (ref 0.7–3.1)
Lymphs: 31 %
MCH: 30.3 pg (ref 26.6–33.0)
MCHC: 32.8 g/dL (ref 31.5–35.7)
MCV: 92 fL (ref 79–97)
Monocytes Absolute: 0.5 10*3/uL (ref 0.1–0.9)
Monocytes: 6 %
Neutrophils Absolute: 4.7 10*3/uL (ref 1.4–7.0)
Neutrophils: 61 %
Platelets: 258 10*3/uL (ref 150–450)
RBC: 4.62 x10E6/uL (ref 3.77–5.28)
RDW: 11.5 % — ABNORMAL LOW (ref 11.7–15.4)
WBC: 7.7 10*3/uL (ref 3.4–10.8)

## 2023-08-18 LAB — TSH: TSH: 1.95 u[IU]/mL (ref 0.450–4.500)

## 2023-08-18 LAB — HIV ANTIBODY (ROUTINE TESTING W REFLEX): HIV Screen 4th Generation wRfx: NONREACTIVE

## 2023-08-18 LAB — HCG, SERUM, QUALITATIVE: hCG,Beta Subunit,Qual,Serum: NEGATIVE m[IU]/mL (ref <6)

## 2023-08-18 LAB — HEMOGLOBIN A1C
Est. average glucose Bld gHb Est-mCnc: 100 mg/dL
Hgb A1c MFr Bld: 5.1 % (ref 4.8–5.6)

## 2023-08-18 LAB — PROTIME-INR
INR: 1.1 (ref 0.9–1.2)
Prothrombin Time: 12 s (ref 9.1–12.0)

## 2023-08-21 ENCOUNTER — Encounter: Payer: Self-pay | Admitting: Family Medicine

## 2023-08-22 ENCOUNTER — Encounter: Payer: Self-pay | Admitting: Family Medicine

## 2023-08-22 ENCOUNTER — Ambulatory Visit: Payer: Medicaid Other

## 2023-08-22 DIAGNOSIS — Z01818 Encounter for other preprocedural examination: Secondary | ICD-10-CM

## 2023-08-25 ENCOUNTER — Encounter: Payer: Self-pay | Admitting: Family Medicine

## 2023-08-25 DIAGNOSIS — Z01818 Encounter for other preprocedural examination: Secondary | ICD-10-CM | POA: Diagnosis not present

## 2023-08-25 DIAGNOSIS — Z6825 Body mass index (BMI) 25.0-25.9, adult: Secondary | ICD-10-CM | POA: Diagnosis not present

## 2023-08-25 DIAGNOSIS — Z5320 Procedure and treatment not carried out because of patient's decision for unspecified reasons: Secondary | ICD-10-CM | POA: Diagnosis not present

## 2023-08-25 DIAGNOSIS — Z20822 Contact with and (suspected) exposure to covid-19: Secondary | ICD-10-CM | POA: Diagnosis not present

## 2023-08-27 DIAGNOSIS — Z419 Encounter for procedure for purposes other than remedying health state, unspecified: Secondary | ICD-10-CM | POA: Diagnosis not present

## 2023-09-16 DIAGNOSIS — Z419 Encounter for procedure for purposes other than remedying health state, unspecified: Secondary | ICD-10-CM | POA: Diagnosis not present

## 2023-10-14 DIAGNOSIS — Z419 Encounter for procedure for purposes other than remedying health state, unspecified: Secondary | ICD-10-CM | POA: Diagnosis not present

## 2023-11-25 DIAGNOSIS — Z419 Encounter for procedure for purposes other than remedying health state, unspecified: Secondary | ICD-10-CM | POA: Diagnosis not present

## 2023-12-25 DIAGNOSIS — Z419 Encounter for procedure for purposes other than remedying health state, unspecified: Secondary | ICD-10-CM | POA: Diagnosis not present

## 2024-01-25 DIAGNOSIS — Z419 Encounter for procedure for purposes other than remedying health state, unspecified: Secondary | ICD-10-CM | POA: Diagnosis not present

## 2024-02-24 DIAGNOSIS — Z419 Encounter for procedure for purposes other than remedying health state, unspecified: Secondary | ICD-10-CM | POA: Diagnosis not present

## 2024-03-11 ENCOUNTER — Ambulatory Visit: Admitting: Family Medicine

## 2024-03-26 DIAGNOSIS — Z419 Encounter for procedure for purposes other than remedying health state, unspecified: Secondary | ICD-10-CM | POA: Diagnosis not present

## 2024-04-02 ENCOUNTER — Ambulatory Visit (INDEPENDENT_AMBULATORY_CARE_PROVIDER_SITE_OTHER): Admitting: Family Medicine

## 2024-04-02 ENCOUNTER — Encounter: Payer: Self-pay | Admitting: Family Medicine

## 2024-04-02 VITALS — BP 111/68 | HR 93 | Temp 98.1°F | Resp 18 | Ht 62.0 in | Wt 145.0 lb

## 2024-04-02 DIAGNOSIS — J029 Acute pharyngitis, unspecified: Secondary | ICD-10-CM | POA: Insufficient documentation

## 2024-04-02 DIAGNOSIS — R110 Nausea: Secondary | ICD-10-CM | POA: Diagnosis not present

## 2024-04-02 MED ORDER — PENICILLIN V POTASSIUM 500 MG PO TABS
500.0000 mg | ORAL_TABLET | Freq: Two times a day (BID) | ORAL | 0 refills | Status: AC
Start: 2024-04-02 — End: 2024-04-12

## 2024-04-02 MED ORDER — ONDANSETRON 4 MG PO TBDP: 4.0000 mg | ORAL_TABLET | Freq: Three times a day (TID) | ORAL | 0 refills | Status: AC | PRN

## 2024-04-02 MED ORDER — METHYLPREDNISOLONE 4 MG PO TBPK: ORAL_TABLET | ORAL | 0 refills | Status: AC

## 2024-04-02 NOTE — Progress Notes (Signed)
   New Patient Office Visit  Subjective    Patient ID: Carolyn Riley, female    DOB: 11/13/1995  Age: 28 y.o. MRN: [id number]  CC: No chief complaint on file.   HPI Carolyn Riley presents to establish care with this practice. She is new to me. Presents today with sore throat and feeling faint. Blood pressure low at check in. Gatorade provided and graham crackers.    Outpatient Encounter Medications as of 04/02/2024  Medication Sig   dicyclomine  (BENTYL ) 10 MG capsule Take 1 capsule (10 mg total) by mouth 3 (three) times daily as needed for spasms.   pantoprazole  (PROTONIX ) 40 MG tablet Take 1 tablet (40 mg total) by mouth daily. Take 30 minutes before breakfast.   Probiotic Product (PROBIOTIC PO) Take 1 capsule by mouth daily.   No facility-administered encounter medications on file as of 04/02/2024.    Past Medical History:  Diagnosis Date   LGSIL on Pap smear of cervix     Past Surgical History:  Procedure Laterality Date   NO PAST SURGERIES      Family History  Problem Relation Age of Onset   Healthy Mother    Atrial fibrillation Father    Pancreatic cancer Maternal Grandfather    Heart attack Paternal Grandfather    Diabetes Maternal Aunt    Diabetes Maternal Uncle    Heart attack Paternal Uncle    Cancer Neg Hx    Hypertension Neg Hx    Asthma Neg Hx    Birth defects Neg Hx    Stroke Neg Hx     Social History   Socioeconomic History   Marital status: Single    Spouse name: Not on file   Number of children: 1   Years of education: Not on file   Highest education level: Not on file  Occupational History   Occupation: student  Tobacco Use   Smoking status: Never   Smokeless tobacco: Never  Vaping Use   Vaping status: Never Used  Substance and Sexual Activity   Alcohol use: Yes    Comment: social   Drug use: No   Sexual activity: Not Currently    Birth control/protection: None  Other Topics Concern   Not on file  Social History Narrative    Not on file   Social Drivers of Health   Financial Resource Strain: Not on file  Food Insecurity: Not on file  Transportation Needs: Not on file  Physical Activity: Not on file  Stress: Not on file  Social Connections: Not on file  Intimate Partner Violence: Not on file    ROS      Objective    There were no vitals taken for this visit.  Physical Exam      Assessment & Plan:   Problem List Items Addressed This Visit     Establishing care with new doctor, encounter for - Primary   Sore throat   Relevant Orders   POC COVID-19 BinaxNow   POCT rapid strep A    No follow-ups on file.   Darice JONELLE Brownie, FNP

## 2024-04-02 NOTE — Patient Instructions (Signed)
Warm fluids such as tea/honey and warm salt water gargles may help with the discomfort.  Symptomatic and supportive therapy such as OTC topical throat spray such as chloraseptic spray or throat lozenges are recommended. Acetaminophen or ibuprofen according to package instructions for pain. Adequate hydration and rest.

## 2024-04-02 NOTE — Progress Notes (Signed)
 Acute Office Visit  Subjective:     Patient ID: Carolyn Riley, female    DOB: [birth year], 28 y.o.   MRN: [id number]  No chief complaint on file.   HPI Patient is in today for sore throat since Saturday.  Able to swallow saliva. No fever, chills present.  Some congestion, headache, fatigue.  Unable to tolerate throat swabs due to nausea and risk of vomiting.  Blood pressure on arrival low, improved with Gatorade and crackers.   ROS      Objective:    BP 111/68 (Cuff Size: Normal)   Pulse 93   Temp 98.1 F (36.7 C)   Resp 18   Ht 5' 2 (1.575 m)   Wt 145 lb (65.8 kg)   SpO2 97%   BMI 26.52 kg/m    Physical Exam Vitals and nursing note reviewed.  Constitutional:      General: She is not in acute distress.    Appearance: Normal appearance. She is ill-appearing.  HENT:     Mouth/Throat:     Pharynx: Uvula midline. Posterior oropharyngeal erythema present. No oropharyngeal exudate.  Cardiovascular:     Rate and Rhythm: Regular rhythm.     Heart sounds: Normal heart sounds.  Pulmonary:     Effort: Pulmonary effort is normal.     Breath sounds: Normal breath sounds.  Skin:    General: Skin is warm and dry.  Neurological:     General: No focal deficit present.     Mental Status: She is alert. Mental status is at baseline.  Psychiatric:        Mood and Affect: Mood normal.        Behavior: Behavior normal.        Thought Content: Thought content normal.        Judgment: Judgment normal.     No results found for any visits on 04/02/24.      Assessment & Plan:   Problem List Items Addressed This Visit     Sore throat - Primary   Sore throat and chills since Saturday. Difficulty swallowing.  Able to swallow secretions. Airway patent without obvious swelling noted.  Unable to tolerate throat swab due to extreme nausea and risk of vomiting. Will treat as presumed bacterial infection. Penicillin  500 mg BID for 10 days. Medrol  dose pack.  Zofran   for nausea to assist with hydration. She will notify provider if not improved in next 36-48 hours. Support therapy as discussed.  Felt much better after drinking Gatorade in office. No lightheadedness with standing. Follow-up as needed.  Work note provided.         Relevant Medications   penicillin  v potassium (VEETID) 500 MG tablet   methylPREDNISolone  (MEDROL  DOSEPAK) 4 MG TBPK tablet   Other Visit Diagnoses       Nausea       Relevant Medications   ondansetron  (ZOFRAN -ODT) 4 MG disintegrating tablet       Meds ordered this encounter  Medications   penicillin  v potassium (VEETID) 500 MG tablet    Sig: Take 1 tablet (500 mg total) by mouth 2 (two) times daily for 10 days.    Dispense:  20 tablet    Refill:  0    Supervising Provider:   METHENEY, CATHERINE D [[id number]]   ondansetron  (ZOFRAN -ODT) 4 MG disintegrating tablet    Sig: Take 1 tablet (4 mg total) by mouth every 8 (eight) hours as needed for nausea or vomiting.    Dispense:  15  tablet    Refill:  0    Supervising Provider:   METHENEY, CATHERINE D [[id number]]   methylPREDNISolone  (MEDROL  DOSEPAK) 4 MG TBPK tablet    Sig: 6-day pack as directed    Dispense:  21 tablet    Refill:  0    Supervising Provider:   METHENEY, CATHERINE D [[id number]]  Agrees with plan of care discussed.  Questions answered.   Return if symptoms worsen or fail to improve.  Carolyn JONELLE Brownie, FNP

## 2024-04-02 NOTE — Assessment & Plan Note (Addendum)
 Sore throat and chills since Saturday. Difficulty swallowing.  Able to swallow secretions. Airway patent without obvious swelling noted.  Unable to tolerate throat swab due to extreme nausea and risk of vomiting. Will treat as presumed bacterial infection. Penicillin  500 mg BID for 10 days. Medrol  dose pack.  Zofran  for nausea to assist with hydration. She will notify provider if not improved in next 36-48 hours. Support therapy as discussed.  Felt much better after drinking Gatorade in office. No lightheadedness with standing. Follow-up as needed.  Work note provided.

## 2024-04-26 DIAGNOSIS — Z419 Encounter for procedure for purposes other than remedying health state, unspecified: Secondary | ICD-10-CM | POA: Diagnosis not present
